# Patient Record
Sex: Female | Born: 1985 | Race: White | Hispanic: No | Marital: Married | Smoking: Former smoker
Health system: Southern US, Community
[De-identification: ages and names within clinical notes are randomized; demographics above are authoritative.]

## PROBLEM LIST (undated history)

## (undated) ENCOUNTER — Inpatient Hospital Stay (HOSPITAL_COMMUNITY): Payer: Self-pay

## (undated) DIAGNOSIS — A6000 Herpesviral infection of urogenital system, unspecified: Secondary | ICD-10-CM

## (undated) DIAGNOSIS — O139 Gestational [pregnancy-induced] hypertension without significant proteinuria, unspecified trimester: Secondary | ICD-10-CM

## (undated) HISTORY — PX: TONSILLECTOMY: SUR1361

## (undated) HISTORY — DX: Gestational (pregnancy-induced) hypertension without significant proteinuria, unspecified trimester: O13.9

---
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
## 2013-02-26 ENCOUNTER — Emergency Department: Payer: Self-pay | Admitting: Emergency Medicine

## 2013-02-26 LAB — DRUG SCREEN, URINE
Benzodiazepine, Ur Scrn: NEGATIVE (ref ?–200)
Cocaine Metabolite,Ur ~~LOC~~: NEGATIVE (ref ?–300)
Methadone, Ur Screen: NEGATIVE (ref ?–300)
Opiate, Ur Screen: NEGATIVE (ref ?–300)
Phencyclidine (PCP) Ur S: NEGATIVE (ref ?–25)
Tricyclic, Ur Screen: NEGATIVE (ref ?–1000)

## 2013-02-26 LAB — CBC
HCT: 40.3 % (ref 35.0–47.0)
MCH: 32.7 pg (ref 26.0–34.0)
MCV: 95 fL (ref 80–100)
Platelet: 179 10*3/uL (ref 150–440)
RBC: 4.25 10*6/uL (ref 3.80–5.20)
RDW: 13 % (ref 11.5–14.5)
WBC: 12.2 10*3/uL — ABNORMAL HIGH (ref 3.6–11.0)

## 2013-02-26 LAB — COMPREHENSIVE METABOLIC PANEL
Albumin: 4.3 g/dL (ref 3.4–5.0)
Alkaline Phosphatase: 75 U/L (ref 50–136)
Bilirubin,Total: 0.7 mg/dL (ref 0.2–1.0)
Chloride: 106 mmol/L (ref 98–107)
Creatinine: 0.83 mg/dL (ref 0.60–1.30)
EGFR (African American): >60
EGFR (Non-African Amer.): >60
Glucose: 107 mg/dL — ABNORMAL HIGH (ref 65–99)
Osmolality: 274 (ref 275–301)
Potassium: 3.8 mmol/L (ref 3.5–5.1)
SGOT(AST): 23 U/L (ref 15–37)
Sodium: 137 mmol/L (ref 136–145)
Total Protein: 8 g/dL (ref 6.4–8.2)

## 2013-02-26 LAB — ETHANOL: Ethanol: <3 mg/dL

## 2013-03-22 ENCOUNTER — Emergency Department (HOSPITAL_COMMUNITY)
Admission: EM | Admit: 2013-03-22 | Discharge: 2013-03-22 | Disposition: A | Discharge status: Home / Self Care | Payer: Medicaid Other | Attending: Emergency Medicine | Admitting: Emergency Medicine

## 2013-03-22 ENCOUNTER — Emergency Department (HOSPITAL_COMMUNITY): Payer: Medicaid Other

## 2013-03-22 ENCOUNTER — Encounter (HOSPITAL_COMMUNITY): Payer: Self-pay | Admitting: *Deleted

## 2013-03-22 DIAGNOSIS — O9989 Other specified diseases and conditions complicating pregnancy, childbirth and the puerperium: Secondary | ICD-10-CM | POA: Insufficient documentation

## 2013-03-22 DIAGNOSIS — O209 Hemorrhage in early pregnancy, unspecified: Secondary | ICD-10-CM | POA: Insufficient documentation

## 2013-03-22 DIAGNOSIS — O99330 Smoking (tobacco) complicating pregnancy, unspecified trimester: Secondary | ICD-10-CM | POA: Insufficient documentation

## 2013-03-22 DIAGNOSIS — R110 Nausea: Secondary | ICD-10-CM | POA: Insufficient documentation

## 2013-03-22 LAB — BASIC METABOLIC PANEL
Calcium: 9.5 mg/dL (ref 8.4–10.5)
Creatinine, Ser: 0.76 mg/dL (ref 0.50–1.10)
GFR calc non Af Amer: >90 mL/min (ref >90)
Glucose, Bld: 94 mg/dL (ref 70–99)
Sodium: 138 mEq/L (ref 135–145)

## 2013-03-22 LAB — CBC WITH DIFFERENTIAL/PLATELET
Basophils Absolute: 0 10*3/uL (ref 0.0–0.1)
Basophils Relative: 0 % (ref 0–1)
Eosinophils Absolute: 0.3 10*3/uL (ref 0.0–0.7)
Eosinophils Relative: 3 % (ref 0–5)
Lymphs Abs: 1.6 10*3/uL (ref 0.7–4.0)
MCH: 32.9 pg (ref 26.0–34.0)
MCV: 94.2 fL (ref 78.0–100.0)
Neutrophils Relative %: 73 % (ref 43–77)
Platelets: 139 10*3/uL — ABNORMAL LOW (ref 150–400)
RBC: 4.34 MIL/uL (ref 3.87–5.11)
RDW: 12.1 % (ref 11.5–15.5)
WBC: 9.2 10*3/uL (ref 4.0–10.5)

## 2013-03-22 LAB — URINALYSIS, ROUTINE W REFLEX MICROSCOPIC
Ketones, ur: NEGATIVE mg/dL
Leukocytes, UA: NEGATIVE
Protein, ur: NEGATIVE mg/dL
Urobilinogen, UA: 0.2 mg/dL (ref 0.0–1.0)

## 2013-03-22 LAB — POCT PREGNANCY, URINE: Preg Test, Ur: POSITIVE — AB

## 2013-03-22 LAB — HCG, QUANTITATIVE, PREGNANCY: hCG, Beta Chain, Quant, S: 52265 m[IU]/mL — ABNORMAL HIGH (ref <5)

## 2013-03-22 LAB — WET PREP, GENITAL
WBC, Wet Prep HPF POC: NONE SEEN
Yeast Wet Prep HPF POC: NONE SEEN

## 2013-03-22 MED ORDER — PROMETHAZINE HCL 25 MG PO TABS: 25.0000 mg | ORAL_TABLET | Freq: Four times a day (QID) | ORAL | Status: DC | PRN

## 2013-03-22 MED ORDER — PRENATAL COMPLETE 14-0.4 MG PO TABS: 1.0000 | ORAL_TABLET | Freq: Every day | ORAL | Status: DC

## 2013-03-22 NOTE — ED Notes (Signed)
Pt reports vaginal spotting and LMP 02/04/13.  Pt reports abdominal cramping all over

## 2013-03-22 NOTE — ED Provider Notes (Signed)
History    CSN: [id number] Arrival date & time 03/22/13  1146  First MD Initiated Contact with Patient 03/22/13 1245     No chief complaint on file.  (Consider location/radiation/quality/duration/timing/severity/associated sxs/prior Treatment) HPI Comments: Patient is a 27 year old G1P0 female who presents with a 1 day history of vaginal spotting. Symptoms started gradually and remained constant since the onset. Patient reports going to the bathroom this morning and noticing some blood on the toilet paper when she wiped. Patient denies abdominal pain but does report associated nausea throughout her pregnancy. Patient estimates she is about 4 weeks. No aggravating/alleviating factors.   History reviewed. No pertinent past medical history. Past Surgical History  Procedure Laterality Date  . Tonsillectomy     No family history on file. History  Substance Use Topics  . Smoking status: Current Every Day Smoker  . Smokeless tobacco: Not on file  . Alcohol Use: No   OB History   Grav Para Term Preterm Abortions TAB SAB Ect Mult Living   1              Review of Systems  Genitourinary: Positive for vaginal bleeding.  All other systems reviewed and are negative.    Allergies  Augmentin  Home Medications   Current Outpatient Rx  Name  Route  Sig  Dispense  Refill  . calcium carbonate (TUMS - DOSED IN MG ELEMENTAL CALCIUM) 500 MG chewable tablet   Oral   Chew 2 tablets by mouth 2 (two) times daily as needed for heartburn.          BP 138/89  Pulse 102  Temp(Src) 98.3 F (36.8 C) (Oral)  Resp 18  SpO2 98%  LMP 02/04/2013 Physical Exam  Nursing note and vitals reviewed. Constitutional: She is oriented to person, place, and time. She appears well-developed and well-nourished. No distress.  HENT:  Head: Normocephalic and atraumatic.  Eyes: Conjunctivae are normal.  Neck: Normal range of motion.  Cardiovascular: Normal rate and regular rhythm.  Exam reveals no gallop  and no friction rub.   No murmur heard. Pulmonary/Chest: Effort normal and breath sounds normal. She has no wheezes. She has no rales. She exhibits no tenderness.  Abdominal: Soft. She exhibits no distension. There is no tenderness. There is no rebound and no guarding.  Genitourinary:  No blood or discharge noted. Cervical os closed. No CMT. No tenderness to palpation on bimanual exam.   Musculoskeletal: Normal range of motion.  Neurological: She is alert and oriented to person, place, and time. Coordination normal.  Speech is goal-oriented. Moves limbs without ataxia.   Skin: Skin is warm and dry.  Psychiatric: She has a normal mood and affect. Her behavior is normal.    ED Course  Procedures (including critical care time) Labs Reviewed  WET PREP, GENITAL - Abnormal; Notable for the following:    Clue Cells Wet Prep HPF POC FEW (*)    All other components within normal limits  URINALYSIS, ROUTINE W REFLEX MICROSCOPIC - Abnormal; Notable for the following:    APPearance CLOUDY (*)    All other components within normal limits  CBC WITH DIFFERENTIAL - Abnormal; Notable for the following:    Platelets 139 (*)    All other components within normal limits  POCT PREGNANCY, URINE - Abnormal; Notable for the following:    Preg Test, Ur POSITIVE (*)    All other components within normal limits  GC/CHLAMYDIA PROBE AMP  BASIC METABOLIC PANEL   US  Ob Comp Less 14 Wks  03/22/2013   *RADIOLOGY REPORT*  Clinical Data: Vaginal bleeding.  OBSTETRIC <14 WK [place] AND TRANSVAGINAL OB US  Technique:  Both transabdominal and transvaginal ultrasound examinations were performed for complete evaluation of the gestation as well as the maternal uterus, adnexal regions, and pelvic cul-de-sac.  Transvaginal technique was performed to assess early pregnancy.  Comparison:  None.  Intrauterine gestational sac:  Single Yolk sac: Present Embryo: Present Cardiac Activity: Present Heart Rate: 113 bpm  CRL: 6.4  mm  6 weeks  3 days  [place] EDC: 11/12/2013  Maternal uterus/adnexae: Normal ovaries.  No free fluid.  IMPRESSION:  1.  Single intrauterine gestation with embryo and normal cardiac activity. 2.  Estimate gestational age by crown-rump length equals 6 weeks 3 days.   Original Report Authenticated By: Genevive Bi, M.D.   US Ob Transvaginal  03/22/2013   *RADIOLOGY REPORT*  Clinical Data: Vaginal bleeding.  OBSTETRIC <14 WK [place] AND TRANSVAGINAL OB US  Technique:  Both transabdominal and transvaginal ultrasound examinations were performed for complete evaluation of the gestation as well as the maternal uterus, adnexal regions, and pelvic cul-de-sac.  Transvaginal technique was performed to assess early pregnancy.  Comparison:  None.  Intrauterine gestational sac:  Single Yolk sac: Present Embryo: Present Cardiac Activity: Present Heart Rate: 113 bpm  CRL: 6.4  mm  6 weeks 3 days  [place] EDC: 11/12/2013  Maternal uterus/adnexae: Normal ovaries.  No free fluid.  IMPRESSION:  1.  Single intrauterine gestation with embryo and normal cardiac activity. 2.  Estimate gestational age by crown-rump length equals 6 weeks 3 days.   Original Report Authenticated By: Genevive Bi, M.D.     1. Vaginal bleeding in pregnancy, first trimester     MDM  1:28 PM Labs unremarkable. Urine pregnancy test positive. Patient will have pelvic exam, US obgyn, and hcg. Vitals stable and patient afebrile.   Patient signed out to [facility] pending OB US.   Emilia Beck, PA-C 03/23/13 1121

## 2013-03-22 NOTE — ED Notes (Signed)
Pt states that she took pregnancy test one week ago.  Pt states that she continues to vomit constantly and it is not getting better

## 2013-03-26 NOTE — ED Provider Notes (Signed)
History/physical exam/procedure(s) were performed by non-physician practitioner and as supervising physician I was immediately available for consultation/collaboration. I have reviewed all notes and am in agreement with care and plan.   Hilario Quarry, MD 03/26/13 1227

## 2013-05-13 LAB — OB RESULTS CONSOLE HEPATITIS B SURFACE ANTIGEN: HEP B S AG: NEGATIVE

## 2013-05-13 LAB — OB RESULTS CONSOLE RUBELLA ANTIBODY, IGM: RUBELLA: IMMUNE

## 2013-05-13 LAB — OB RESULTS CONSOLE ABO/RH: RH Type: POSITIVE

## 2013-05-13 LAB — OB RESULTS CONSOLE RPR: RPR: NONREACTIVE

## 2013-05-13 LAB — OB RESULTS CONSOLE ANTIBODY SCREEN: Antibody Screen: NEGATIVE

## 2013-05-13 LAB — OB RESULTS CONSOLE HIV ANTIBODY (ROUTINE TESTING): HIV: NONREACTIVE

## 2013-09-26 LAB — OB RESULTS CONSOLE GC/CHLAMYDIA
Chlamydia: NEGATIVE
Gonorrhea: NEGATIVE

## 2013-09-29 NOTE — L&D Delivery Note (Signed)
Delivery Note At 11:03 PM a viable female, "Shannon Jarvis", was delivered via Vaginal, Spontaneous Delivery (Presentation:ROA ;  ).  APGAR: , ; weight .   Placenta status: Intact, Spontaneous.  Cord:  with the following complications: None.  Cord pH: NA  Anesthesia:  Epidural Episiotomy: None Lacerations: 1st degree vaginal laceration Suture Repair: 2.0 vicryl rapide Est. Blood Loss (mL): 200 cc  Mom to postpartum.  Baby to Couplet care / Skin to Skin. Family plans outpatient circumcision. Will start magnesium sulfate for 24 hour pp. Repeat PIH labs in am.  Nigel BridgemanLATHAM, Hanne Kegg 11/02/2013, 11:47 PM

## 2013-10-19 LAB — OB RESULTS CONSOLE GBS: GBS: POSITIVE

## 2013-10-31 ENCOUNTER — Encounter (HOSPITAL_COMMUNITY): Payer: Self-pay | Admitting: *Deleted

## 2013-10-31 ENCOUNTER — Inpatient Hospital Stay (HOSPITAL_COMMUNITY)
Admission: AD | Admit: 2013-10-31 | Discharge: 2013-10-31 | Disposition: A | Discharge status: Home / Self Care | Payer: Medicaid Other | Source: Ambulatory Visit | Attending: Obstetrics and Gynecology | Admitting: Obstetrics and Gynecology

## 2013-10-31 DIAGNOSIS — R109 Unspecified abdominal pain: Secondary | ICD-10-CM | POA: Insufficient documentation

## 2013-10-31 DIAGNOSIS — IMO0002 Reserved for concepts with insufficient information to code with codable children: Secondary | ICD-10-CM | POA: Insufficient documentation

## 2013-10-31 HISTORY — DX: Herpesviral infection of urogenital system, unspecified: A60.00

## 2013-10-31 LAB — CBC
HCT: 35.1 % — ABNORMAL LOW (ref 36.0–46.0)
HEMOGLOBIN: 11.9 g/dL — AB (ref 12.0–15.0)
MCH: 32.8 pg (ref 26.0–34.0)
MCHC: 33.9 g/dL (ref 30.0–36.0)
MCV: 96.7 fL (ref 78.0–100.0)
Platelets: 110 10*3/uL — ABNORMAL LOW (ref 150–400)
RBC: 3.63 MIL/uL — AB (ref 3.87–5.11)
RDW: 13.2 % (ref 11.5–15.5)
WBC: 11.4 10*3/uL — ABNORMAL HIGH (ref 4.0–10.5)

## 2013-10-31 LAB — URINALYSIS, ROUTINE W REFLEX MICROSCOPIC
BILIRUBIN URINE: NEGATIVE
Glucose, UA: NEGATIVE mg/dL
HGB URINE DIPSTICK: NEGATIVE
Ketones, ur: NEGATIVE mg/dL
Leukocytes, UA: NEGATIVE
Nitrite: NEGATIVE
PROTEIN: 30 mg/dL — AB
Specific Gravity, Urine: 1.02 (ref 1.005–1.030)
UROBILINOGEN UA: 0.2 mg/dL (ref 0.0–1.0)
pH: 8 (ref 5.0–8.0)

## 2013-10-31 LAB — COMPREHENSIVE METABOLIC PANEL
ALT: 8 U/L (ref 0–35)
AST: 15 U/L (ref 0–37)
Albumin: 2.2 g/dL — ABNORMAL LOW (ref 3.5–5.2)
Alkaline Phosphatase: 144 U/L — ABNORMAL HIGH (ref 39–117)
BUN: 11 mg/dL (ref 6–23)
CALCIUM: 9.1 mg/dL (ref 8.4–10.5)
CHLORIDE: 101 meq/L (ref 96–112)
CO2: 23 meq/L (ref 19–32)
Creatinine, Ser: 0.66 mg/dL (ref 0.50–1.10)
GLUCOSE: 91 mg/dL (ref 70–99)
Potassium: 4.5 mEq/L (ref 3.7–5.3)
SODIUM: 136 meq/L — AB (ref 137–147)
Total Protein: 6.1 g/dL (ref 6.0–8.3)

## 2013-10-31 LAB — URINE MICROSCOPIC-ADD ON

## 2013-10-31 LAB — PROTEIN / CREATININE RATIO, URINE
Creatinine, Urine: 181.25 mg/dL
Protein Creatinine Ratio: 0.3 — ABNORMAL HIGH (ref 0.00–0.15)
TOTAL PROTEIN, URINE: 54.7 mg/dL

## 2013-10-31 NOTE — Discharge Instructions (Signed)
Preeclampsia and Eclampsia °Preeclampsia is a condition of high blood pressure during pregnancy. It can happen at 20 weeks or later in pregnancy. If high blood pressure occurs in the second half of pregnancy with no other symptoms, it is called gestational hypertension and goes away after the baby is born. If any of the symptoms listed below develop with gestational hypertension, it is then called preeclampsia. Eclampsia (convulsions) may follow preeclampsia. This is one of the reasons for regular prenatal checkups. Early diagnosis and treatment are very important to prevent eclampsia. °CAUSES  °There is no known cause of preeclampsia/eclampsia in pregnancy. There are several known conditions that may put the pregnant woman at risk, such as: °· The first pregnancy. °· Having preeclampsia in a past pregnancy. °· Having lasting (chronic) high blood pressure. °· Having multiples (twins, triplets). °· Being age 35 or older. °· African American ethnic background. °· Having kidney disease or diabetes. °· Medical conditions such as lupus or blood diseases. °· Being overweight (obese). °SYMPTOMS  °· High blood pressure. °· Headaches. °· Sudden weight gain. °· Swelling of hands, face, legs, and feet. °· Protein in the urine. °· Feeling sick to your stomach (nauseous) and throwing up (vomiting). °· Vision problems (blurred or double vision). °· Numbness in the face, arms, legs, and feet. °· Dizziness. °· Slurred speech. °· Preeclampsia can cause growth retardation in the fetus. °· Separation (abruption) of the placenta. °· Not enough fluid in the amniotic sac (oligohydramnios). °· Sensitivity to bright lights. °· Belly (abdominal) pain. °DIAGNOSIS  °If protein is found in the urine in the second half of pregnancy, this is considered preeclampsia. Other symptoms mentioned above may also be present. °TREATMENT  °It is necessary to treat this. °· Your caregiver may prescribe bed rest early in this condition. Plenty of rest and  salt restriction may be all that is needed. °· Medicines may be necessary to lower blood pressure if the condition does not respond to more conservative measures. °· In more severe cases, hospitalization may be needed: °· For treatment of blood pressure. °· To control fluid retention. °· To monitor the baby to see if the condition is causing harm to the baby. °· Hospitalization is the best way to treat the first sign of preeclampsia. This is so the mother and baby can be watched closely and blood tests can be done effectively and correctly. °· If the condition becomes severe, it may be necessary to induce labor or to remove the infant by surgical means (cesarean section). The best cure for preeclampsia/eclampsia is to deliver the baby. °Preeclampsia and eclampsia involve risks to mother and infant. Your caregiver will discuss these risks with you. Together, you can work out the best possible approach to your problems. Make sure you keep your prenatal visits as scheduled. Not keeping appointments could result in a chronic or permanent injury, pain, disability to you, and death or injury to you or your unborn baby. If there is any problem keeping the appointment, you must call to reschedule. °HOME CARE INSTRUCTIONS  °· Keep your prenatal appointments and tests as scheduled. °· Tell your caregiver if you have any of the above risk factors. °· Get plenty of rest and sleep. °· Eat a balanced diet that is low in salt, and do not add salt to your food. °· Avoid stressful situations. °· Only take over-the-counter and prescriptions medicines for pain, discomfort, or fever as directed by your caregiver. °SEEK IMMEDIATE MEDICAL CARE IF:  °· You develop severe swelling   anywhere in the body. This usually occurs in the legs. °· You gain 05 lb/2.3 kg or more in a week. °· You develop a severe headache, dizziness, problems with your vision, or confusion. °· You have abdominal pain, nausea, or vomiting. °· You have a seizure. °· You  have trouble moving any part of your body, or you develop numbness or problems speaking. °· You have bruising or abnormal bleeding from anywhere in the body. °· You develop a stiff neck. °· You pass out. °MAKE SURE YOU:  °· Understand these instructions. °· Will watch your condition. °· Will get help right away if you are not doing well or get worse. °Document Released: 09/12/2000 Document Revised: 12/08/2011 Document Reviewed: 04/28/2008 °ExitCare® Patient Information ©2014 [employer]. ° °Fetal Movement Counts °Patient Name: __________________________________________________ Patient Due Date: ____________________ °Performing a fetal movement count is highly recommended in high-risk pregnancies, but it is good for every pregnant woman to do. Your caregiver may ask you to start counting fetal movements at 28 weeks of the pregnancy. Fetal movements often increase: °· After eating a full meal. °· After physical activity. °· After eating or drinking something sweet or cold. °· At rest. °Pay attention to when you feel the baby is most active. This will help you notice a pattern of your baby's sleep and wake cycles and what factors contribute to an increase in fetal movement. It is important to perform a fetal movement count at the same time each day when your baby is normally most active.  °HOW TO COUNT FETAL MOVEMENTS °1. Find a quiet and comfortable area to sit or lie down on your left side. Lying on your left side provides the best blood and oxygen circulation to your baby. °2. Write down the day and time on a sheet of paper or in a journal. °3. Start counting kicks, flutters, swishes, rolls, or jabs in a 2 hour period. You should feel at least 10 movements within 2 hours. °4. If you do not feel 10 movements in 2 hours, wait 2 3 hours and count again. Look for a change in the pattern or not enough counts in 2 hours. °SEEK MEDICAL CARE IF: °· You feel less than 10 counts in 2 hours, tried twice. °· There is no  movement in over an hour. °· The pattern is changing or taking longer each day to reach 10 counts in 2 hours. °· You feel the baby is not moving as he or she usually does. °Date: ____________ Movements: ____________ Start time: ____________ Finish time: ____________  °Date: ____________ Movements: ____________ Start time: ____________ Finish time: ____________ °Date: ____________ Movements: ____________ Start time: ____________ Finish time: ____________ °Date: ____________ Movements: ____________ Start time: ____________ Finish time: ____________ °Date: ____________ Movements: ____________ Start time: ____________ Finish time: ____________ °Date: ____________ Movements: ____________ Start time: ____________ Finish time: ____________ °Date: ____________ Movements: ____________ Start time: ____________ Finish time: ____________ °Date: ____________ Movements: ____________ Start time: ____________ Finish time: ____________  °Date: ____________ Movements: ____________ Start time: ____________ Finish time: ____________ °Date: ____________ Movements: ____________ Start time: ____________ Finish time: ____________ °Date: ____________ Movements: ____________ Start time: ____________ Finish time: ____________ °Date: ____________ Movements: ____________ Start time: ____________ Finish time: ____________ °Date: ____________ Movements: ____________ Start time: ____________ Finish time: ____________ °Date: ____________ Movements: ____________ Start time: ____________ Finish time: ____________ °Date: ____________ Movements: ____________ Start time: ____________ Finish time: ____________  °Date: ____________ Movements: ____________ Start time: ____________ Finish time: ____________ °Date: ____________ Movements: ____________ Start time: ____________ Finish time: ____________ °  ____________ Movements: ____________ Start time: ____________ Doreatha MartinFinish time: ____________ Date: ____________ Movements: ____________ Start time:  ____________ Doreatha MartinFinish time: ____________ Date: ____________ Movements: ____________ Start time: ____________ Doreatha MartinFinish time: ____________ Date: ____________ Movements: ____________ Start time: ____________ Doreatha MartinFinish time: ____________ Date: ____________ Movements: ____________ Start time: ____________ Doreatha MartinFinish time: ____________  Date: ____________ Movements: ____________ Start time: ____________ Doreatha MartinFinish time: ____________ Date: ____________ Movements: ____________ Start time: ____________ Doreatha MartinFinish time: ____________ Date: ____________ Movements: ____________ Start time: ____________ Doreatha MartinFinish time: ____________ Date: ____________ Movements: ____________ Start time: ____________ Doreatha MartinFinish time: ____________ Date: ____________ Movements: ____________ Start time: ____________ Doreatha MartinFinish time: ____________ Date: ____________ Movements: ____________ Start time: ____________ Doreatha MartinFinish time: ____________ Date: ____________ Movements: ____________ Start time: ____________ Doreatha MartinFinish time: ____________  Date: ____________ Movements: ____________ Start time: ____________ Doreatha MartinFinish time: ____________ Date: ____________ Movements: ____________ Start time: ____________ Doreatha MartinFinish time: ____________ Date: ____________ Movements: ____________ Start time: ____________ Doreatha MartinFinish time: ____________ Date: ____________ Movements: ____________ Start time: ____________ Doreatha MartinFinish time: ____________ Date: ____________ Movements: ____________ Start time: ____________ Doreatha MartinFinish time: ____________ Date: ____________ Movements: ____________ Start time: ____________ Doreatha MartinFinish time: ____________ Date: ____________ Movements: ____________ Start time: ____________ Doreatha MartinFinish time: ____________  Date: ____________ Movements: ____________ Start time: ____________ Doreatha MartinFinish time: ____________ Date: ____________ Movements: ____________ Start time: ____________ Doreatha MartinFinish time: ____________ Date: ____________ Movements: ____________ Start time: ____________ Doreatha MartinFinish time: ____________ Date:  ____________ Movements: ____________ Start time: ____________ Doreatha MartinFinish time: ____________ Date: ____________ Movements: ____________ Start time: ____________ Doreatha MartinFinish time: ____________ Date: ____________ Movements: ____________ Start time: ____________ Doreatha MartinFinish time: ____________ Date: ____________ Movements: ____________ Start time: ____________ Doreatha MartinFinish time: ____________  Date: ____________ Movements: ____________ Start time: ____________ Doreatha MartinFinish time: ____________ Date: ____________ Movements: ____________ Start time: ____________ Doreatha MartinFinish time: ____________ Date: ____________ Movements: ____________ Start time: ____________ Doreatha MartinFinish time: ____________ Date: ____________ Movements: ____________ Start time: ____________ Doreatha MartinFinish time: ____________ Date: ____________ Movements: ____________ Start time: ____________ Doreatha MartinFinish time: ____________ Date: ____________ Movements: ____________ Start time: ____________ Doreatha MartinFinish time: ____________ Date: ____________ Movements: ____________ Start time: ____________ Doreatha MartinFinish time: ____________  Date: ____________ Movements: ____________ Start time: ____________ Doreatha MartinFinish time: ____________ Date: ____________ Movements: ____________ Start time: ____________ Doreatha MartinFinish time: ____________ Date: ____________ Movements: ____________ Start time: ____________ Doreatha MartinFinish time: ____________ Date: ____________ Movements: ____________ Start time: ____________ Doreatha MartinFinish time: ____________ Date: ____________ Movements: ____________ Start time: ____________ Doreatha MartinFinish time: ____________ Date: ____________ Movements: ____________ Start time: ____________ Doreatha MartinFinish time: ____________ Document Released: 10/15/2006 Document Revised: 09/01/2012 Document Reviewed: 07/12/2012 Exit[employer] Patient Information 2014 [employer].

## 2013-10-31 NOTE — MAU Note (Signed)
Visual changes when changing positions quickly, noticed increased edema over the last 2-3 weeks, has gained approx 4-5 lbs since last week.  Checked BP @ [facility], was 157/96, then 124/86.  Pt denies bleeding or LOF.  Lower abd pain worse in the last 1-2 weeks.

## 2013-11-01 ENCOUNTER — Encounter (HOSPITAL_COMMUNITY): Payer: Self-pay | Admitting: *Deleted

## 2013-11-01 ENCOUNTER — Inpatient Hospital Stay (HOSPITAL_COMMUNITY)
Admission: AD | Admit: 2013-11-01 | Discharge: 2013-11-04 | DRG: 774 | Disposition: A | Discharge status: Home / Self Care | Payer: Medicaid Other | Source: Ambulatory Visit | Attending: Obstetrics and Gynecology | Admitting: Obstetrics and Gynecology

## 2013-11-01 DIAGNOSIS — B009 Herpesviral infection, unspecified: Secondary | ICD-10-CM | POA: Diagnosis not present

## 2013-11-01 DIAGNOSIS — O9903 Anemia complicating the puerperium: Secondary | ICD-10-CM | POA: Diagnosis not present

## 2013-11-01 DIAGNOSIS — B951 Streptococcus, group B, as the cause of diseases classified elsewhere: Secondary | ICD-10-CM | POA: Diagnosis present

## 2013-11-01 DIAGNOSIS — O99214 Obesity complicating childbirth: Secondary | ICD-10-CM

## 2013-11-01 DIAGNOSIS — O09299 Supervision of pregnancy with other poor reproductive or obstetric history, unspecified trimester: Secondary | ICD-10-CM | POA: Diagnosis present

## 2013-11-01 DIAGNOSIS — O9989 Other specified diseases and conditions complicating pregnancy, childbirth and the puerperium: Secondary | ICD-10-CM

## 2013-11-01 DIAGNOSIS — O98519 Other viral diseases complicating pregnancy, unspecified trimester: Secondary | ICD-10-CM | POA: Diagnosis present

## 2013-11-01 DIAGNOSIS — K219 Gastro-esophageal reflux disease without esophagitis: Secondary | ICD-10-CM | POA: Diagnosis present

## 2013-11-01 DIAGNOSIS — N907 Vulvar cyst: Secondary | ICD-10-CM | POA: Diagnosis present

## 2013-11-01 DIAGNOSIS — IMO0002 Reserved for concepts with insufficient information to code with codable children: Principal | ICD-10-CM | POA: Diagnosis present

## 2013-11-01 DIAGNOSIS — D696 Thrombocytopenia, unspecified: Secondary | ICD-10-CM | POA: Diagnosis present

## 2013-11-01 DIAGNOSIS — Z22330 Carrier of Group B streptococcus: Secondary | ICD-10-CM

## 2013-11-01 DIAGNOSIS — E669 Obesity, unspecified: Secondary | ICD-10-CM | POA: Diagnosis present

## 2013-11-01 DIAGNOSIS — D649 Anemia, unspecified: Secondary | ICD-10-CM | POA: Diagnosis not present

## 2013-11-01 DIAGNOSIS — O9912 Other diseases of the blood and blood-forming organs and certain disorders involving the immune mechanism complicating childbirth: Secondary | ICD-10-CM

## 2013-11-01 DIAGNOSIS — F17200 Nicotine dependence, unspecified, uncomplicated: Secondary | ICD-10-CM | POA: Diagnosis present

## 2013-11-01 DIAGNOSIS — D689 Coagulation defect, unspecified: Secondary | ICD-10-CM | POA: Diagnosis present

## 2013-11-01 DIAGNOSIS — O99334 Smoking (tobacco) complicating childbirth: Secondary | ICD-10-CM | POA: Diagnosis present

## 2013-11-01 DIAGNOSIS — A6000 Herpesviral infection of urogenital system, unspecified: Secondary | ICD-10-CM | POA: Diagnosis present

## 2013-11-01 DIAGNOSIS — O99892 Other specified diseases and conditions complicating childbirth: Secondary | ICD-10-CM | POA: Diagnosis present

## 2013-11-01 LAB — URINE CULTURE
CULTURE: NO GROWTH
Colony Count: NO GROWTH

## 2013-11-01 LAB — PROTEIN / CREATININE RATIO, URINE
CREATININE, URINE: 186.19 mg/dL
PROTEIN CREATININE RATIO: 0.31 — AB (ref 0.00–0.15)
TOTAL PROTEIN, URINE: 56.8 mg/dL

## 2013-11-01 LAB — COMPREHENSIVE METABOLIC PANEL
ALT: 10 U/L (ref 0–35)
AST: 19 U/L (ref 0–37)
Albumin: 2.3 g/dL — ABNORMAL LOW (ref 3.5–5.2)
Alkaline Phosphatase: 161 U/L — ABNORMAL HIGH (ref 39–117)
BUN: 12 mg/dL (ref 6–23)
CALCIUM: 9.6 mg/dL (ref 8.4–10.5)
CO2: 24 meq/L (ref 19–32)
CREATININE: 0.63 mg/dL (ref 0.50–1.10)
Chloride: 101 mEq/L (ref 96–112)
GFR calc non Af Amer: >90 mL/min (ref >90)
GLUCOSE: 76 mg/dL (ref 70–99)
Potassium: 4.7 mEq/L (ref 3.7–5.3)
Sodium: 137 mEq/L (ref 137–147)
TOTAL PROTEIN: 6 g/dL (ref 6.0–8.3)
Total Bilirubin: <0.2 mg/dL — ABNORMAL LOW (ref 0.3–1.2)

## 2013-11-01 LAB — CBC
HCT: 37.1 % (ref 36.0–46.0)
Hemoglobin: 12.9 g/dL (ref 12.0–15.0)
MCH: 33.5 pg (ref 26.0–34.0)
MCHC: 34.8 g/dL (ref 30.0–36.0)
MCV: 96.4 fL (ref 78.0–100.0)
PLATELETS: 112 10*3/uL — AB (ref 150–400)
RBC: 3.85 MIL/uL — AB (ref 3.87–5.11)
RDW: 13.2 % (ref 11.5–15.5)
WBC: 11.4 10*3/uL — ABNORMAL HIGH (ref 4.0–10.5)

## 2013-11-01 LAB — URIC ACID: Uric Acid, Serum: 6 mg/dL (ref 2.4–7.0)

## 2013-11-01 LAB — LACTATE DEHYDROGENASE: LDH: 270 U/L — ABNORMAL HIGH (ref 94–250)

## 2013-11-01 MED ORDER — PENICILLIN G POTASSIUM 5000000 UNITS IJ SOLR: 2.5000 10*6.[IU] | INTRAMUSCULAR | Status: DC

## 2013-11-01 MED ORDER — CITRIC ACID-SODIUM CITRATE 334-500 MG/5ML PO SOLN
30.0000 mL | ORAL | Status: DC | PRN
  Administered 2013-11-02: 30 mL via ORAL
  Filled 2013-11-01: qty 15

## 2013-11-01 MED ORDER — PENICILLIN G POTASSIUM 5000000 UNITS IJ SOLR: 5.0000 10*6.[IU] | Freq: Once | INTRAVENOUS | Status: DC

## 2013-11-01 MED ORDER — LACTATED RINGERS IV SOLN
INTRAVENOUS | Status: DC
  Administered 2013-11-01 – 2013-11-02 (×4): via INTRAVENOUS

## 2013-11-01 MED ORDER — FAMOTIDINE 20 MG PO TABS
20.0000 mg | ORAL_TABLET | Freq: Once | ORAL | Status: AC
  Administered 2013-11-01: 20 mg via ORAL
  Filled 2013-11-01: qty 1

## 2013-11-01 MED ORDER — ONDANSETRON HCL 4 MG/2ML IJ SOLN: 4.0000 mg | Freq: Four times a day (QID) | INTRAMUSCULAR | Status: DC | PRN

## 2013-11-01 MED ORDER — MAGNESIUM SULFATE 40 G IN LACTATED RINGERS - SIMPLE
2.0000 g/h | INTRAVENOUS | Status: DC
  Filled 2013-11-01: qty 500

## 2013-11-01 MED ORDER — LIDOCAINE HCL (PF) 1 % IJ SOLN
30.0000 mL | INTRAMUSCULAR | Status: DC | PRN
  Filled 2013-11-01 (×2): qty 30

## 2013-11-01 MED ORDER — OXYTOCIN BOLUS FROM INFUSION
500.0000 mL | INTRAVENOUS | Status: DC
  Administered 2013-11-02: 500 mL via INTRAVENOUS

## 2013-11-01 MED ORDER — LACTATED RINGERS IV SOLN: 500.0000 mL | INTRAVENOUS | Status: DC | PRN

## 2013-11-01 MED ORDER — ACETAMINOPHEN 325 MG PO TABS: 650.0000 mg | ORAL_TABLET | ORAL | Status: DC | PRN

## 2013-11-01 MED ORDER — BUTORPHANOL TARTRATE 1 MG/ML IJ SOLN: 1.0000 mg | INTRAMUSCULAR | Status: DC | PRN

## 2013-11-01 MED ORDER — MISOPROSTOL 25 MCG QUARTER TABLET
25.0000 ug | ORAL_TABLET | ORAL | Status: DC | PRN
  Administered 2013-11-02 (×2): 25 ug via VAGINAL
  Filled 2013-11-01: qty 1
  Filled 2013-11-01 (×2): qty 0.25

## 2013-11-01 MED ORDER — FLEET ENEMA 7-19 GM/118ML RE ENEM: 1.0000 | ENEMA | RECTAL | Status: DC | PRN

## 2013-11-01 MED ORDER — OXYTOCIN 40 UNITS IN LACTATED RINGERS INFUSION - SIMPLE MED: 62.5000 mL/h | INTRAVENOUS | Status: DC

## 2013-11-01 MED ORDER — OXYCODONE-ACETAMINOPHEN 5-325 MG PO TABS
1.0000 | ORAL_TABLET | ORAL | Status: DC | PRN
Start: 2013-11-01 — End: 2013-11-03

## 2013-11-01 MED ORDER — TERBUTALINE SULFATE 1 MG/ML IJ SOLN: 0.2500 mg | Freq: Once | INTRAMUSCULAR | Status: AC | PRN

## 2013-11-01 MED ORDER — MAGNESIUM SULFATE BOLUS VIA INFUSION
4.0000 g | Freq: Once | INTRAVENOUS | Status: AC
  Administered 2013-11-02: 4 g via INTRAVENOUS
  Filled 2013-11-01: qty 500

## 2013-11-01 MED ORDER — IBUPROFEN 600 MG PO TABS
600.0000 mg | ORAL_TABLET | Freq: Four times a day (QID) | ORAL | Status: DC | PRN
  Administered 2013-11-02: 600 mg via ORAL
  Filled 2013-11-01: qty 1

## 2013-11-01 MED ORDER — OXYTOCIN 40 UNITS IN LACTATED RINGERS INFUSION - SIMPLE MED: 1.0000 m[IU]/min | INTRAVENOUS | Status: DC

## 2013-11-01 NOTE — H&P (Signed)
Shannon Jarvis is a 28 y.o. female, G1P0 at 5238 3/7 weeks, presenting for induction due to mild pre-eclampsia.  Seen yesterday in MAU for elevated BP, increased edema.  PCR was 0.30, cervix 1 cm.  Instructed to f/u at office today.  Still reporting persistent HA, some visual floaters, no epigastric pain.  Evaluated in MAU, with PCR 0.31, and BP 122-137/83-93.  Platelets 112 today (110 yesterday). Admitted now for induction due to mild pre-eclampsia.  Patient Active Problem List   Diagnosis Date Noted  . 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 Mild or unspecified pre-eclampsia 11/01/2013  . Positive GBS test 11/01/2013    History of present pregnancy: Patient entered care at 17 3/7 weeks. EDC of  was established by LMP and in agreement with US at 20 3/7 weeks. Anatomy scan:  20 3/7 weeks with normal findings and an posterior placenta.   Additional US evaluations:  None   Significant prenatal events:  Blurry vision at 24 weeks, normotensive.  Flu vaccine and TDaP declined.  Seen at MAU at 33 weeks due to pain/pressure.  HSV outbreak on 1/13, despite being on Valtrex--not an initial outbreak.   Seen in MAU 10/31/13, for Shannon Jarvis evaluation.  Elevated PCR (0.31).  1-2+ edema noted last several weeks, but normotensive in office. Last evaluation:  11/01/13, reactive NST done for decreased FM.  Sent to MAU for further assessment of persistent HA and floaters.  OB History   Grav Para Term Preterm Abortions TAB SAB Ect Mult Living   1              Past Medical History  Diagnosis Date  . Genital herpes    Past Surgical History  Procedure Laterality Date  . Tonsillectomy     Family History: family history includes Lupus in her maternal aunt. PGF heart dx;  Mother varicose veins.  Social History:  reports that she has been smoking Cigarettes.  She has been smoking about 0.25 packs per day. She does not have any smokeless tobacco history on file. She reports that she does not drink  alcohol or use illicit drugs.  [name] is involved and supportive.  Patient has 2 years of college, is single, and is a homemaker.   Prenatal Transfer Tool  Maternal Diabetes: No Genetic Screening: Normal Quad screen Maternal Ultrasounds/Referrals: Normal Fetal Ultrasounds or other Referrals:  None Maternal Substance Abuse:  Yes:  Type: Smoker Significant Maternal Medications:  None Significant Maternal Lab Results: Lab values include: Group B Strep positive    ROS:  Mild HA, occasional contractions, +FM.  Allergies  Allergen Reactions  . Augmentin [Amoxicillin-Pot Clavulanate] Hives and Other (See Comments)    Joint swelling  Can take Amoxicillin   Dilation: 1 Effacement (%): 60 Station: -1 Exam by:: Shannon Jarvis CNM Blood pressure 135/85, pulse 94, temperature 98.6 F (37 C), temperature source Oral, resp. rate 18, height 5\' 3"  (1.6 m), weight 216 lb (97.977 kg), last menstrual period 02/04/2013.  Filed Vitals:   11/01/13 2101 11/01/13 2131 11/01/13 2214 11/02/13 0000  BP: 136/83 129/84 137/89 135/85  Pulse: 91 85 92 94  Temp:   98.6 F (37 C) 98.9 F (37.2 C)  TempSrc:   Oral Oral  Resp:   18 18  Height:      Weight:         Chest clear Heart RRR without murmur Abd gravid, NT, FH 38 cm Pelvic: See above Ext: DTR 1+, no clonus, 1+ edema  FHR:  Category 1 UCs: Occasional, mild  Prenatal labs: ABO, Rh: O/Positive/-- (08/15 0000) Antibody: Negative (08/15 0000) Rubella:   Immune RPR: Nonreactive (08/15 0000)  HBsAg: Negative (08/15 0000)  HIV: Non-reactive (08/15 0000)  GBS: Positive (01/21 0000) Sickle cell/Hgb electrophoresis:  NA Pap:  05/2013 WNL GC:  Negative 04/2013 and 08/2013 Chlamydia:  Negative 04/2013 and 08/2013 Genetic screenings:  Normal Quad screen Glucola:  WNL Other:  Hgb 12.5 at [facility], 12.2 at 08/16/13, 13 10/19/13. Platelets 146 at [facility], 149 08/16/13, 129 10/19/13. Toxo titers negative       Assessment/Plan: IUP at 38 4/7 weeks Mild  pre-eclampsia GBS positive  Plan: Admitted to [facility] per consult with Shannon Jarvis induction. Routine [facility] orders Plan Cytotech 25 mcg q 4 hours prn, then pitocin. Plan PCN G per standard dosing (can take Ampicillin) Plan magnesium sulfate with onset of active labor. Repeat PIH labs in am. Patient currently waiting for bed in L&D--will defer exam until transferred to [facility]. Reviewed R&B of induction, including prolonged labor, need for serial induction, need for C/S.  Patient and husband seem to understand these risks and wish to proceed.  Shannon Jarvis, Shannon Jarvis, [place] 11/01/13, 7:32p

## 2013-11-01 NOTE — MAU Note (Signed)
Patient states was sent from the office for evaluation. States she was seen in MAU last night. States she had blood and protein in her urine in the office. States she has vomited every morning for the past 4 days. Has been having headaches.

## 2013-11-01 NOTE — MAU Note (Signed)
C/o  N&V once a day for past 4 days; c/o headache that started this AM and took Tylenol 325 mg this AM; headache is better now and pt rates pain @ 3 now but earlier the pain was 9;

## 2013-11-02 ENCOUNTER — Encounter (HOSPITAL_COMMUNITY): Payer: Medicaid Other | Admitting: Anesthesiology

## 2013-11-02 ENCOUNTER — Inpatient Hospital Stay (HOSPITAL_COMMUNITY): Payer: Medicaid Other | Admitting: Anesthesiology

## 2013-11-02 DIAGNOSIS — B009 Herpesviral infection, unspecified: Secondary | ICD-10-CM | POA: Diagnosis not present

## 2013-11-02 DIAGNOSIS — N907 Vulvar cyst: Secondary | ICD-10-CM | POA: Diagnosis present

## 2013-11-02 DIAGNOSIS — D696 Thrombocytopenia, unspecified: Secondary | ICD-10-CM | POA: Diagnosis present

## 2013-11-02 DIAGNOSIS — F17200 Nicotine dependence, unspecified, uncomplicated: Secondary | ICD-10-CM | POA: Diagnosis present

## 2013-11-02 LAB — CBC
HCT: 34.2 % — ABNORMAL LOW (ref 36.0–46.0)
HCT: 36.1 % (ref 36.0–46.0)
Hemoglobin: 11.8 g/dL — ABNORMAL LOW (ref 12.0–15.0)
Hemoglobin: 12.1 g/dL (ref 12.0–15.0)
MCH: 33.3 pg (ref 26.0–34.0)
MCH: 32.3 pg (ref 26.0–34.0)
MCHC: 34.5 g/dL (ref 30.0–36.0)
MCHC: 33.5 g/dL (ref 30.0–36.0)
MCV: 96.6 fL (ref 78.0–100.0)
MCV: 96.3 fL (ref 78.0–100.0)
PLATELETS: 96 10*3/uL — AB (ref 150–400)
Platelets: 97 10*3/uL — ABNORMAL LOW (ref 150–400)
RBC: 3.54 MIL/uL — AB (ref 3.87–5.11)
RBC: 3.75 MIL/uL — ABNORMAL LOW (ref 3.87–5.11)
RDW: 13.3 % (ref 11.5–15.5)
RDW: 13.2 % (ref 11.5–15.5)
WBC: 11.6 10*3/uL — ABNORMAL HIGH (ref 4.0–10.5)
WBC: 11.5 10*3/uL — AB (ref 4.0–10.5)

## 2013-11-02 LAB — COMPREHENSIVE METABOLIC PANEL
ALT: 8 U/L (ref 0–35)
AST: 14 U/L (ref 0–37)
Albumin: 2 g/dL — ABNORMAL LOW (ref 3.5–5.2)
Alkaline Phosphatase: 141 U/L — ABNORMAL HIGH (ref 39–117)
BUN: 11 mg/dL (ref 6–23)
CALCIUM: 8.6 mg/dL (ref 8.4–10.5)
CO2: 21 mEq/L (ref 19–32)
CREATININE: 0.66 mg/dL (ref 0.50–1.10)
Chloride: 103 mEq/L (ref 96–112)
GFR calc non Af Amer: >90 mL/min (ref >90)
Glucose, Bld: 93 mg/dL (ref 70–99)
Potassium: 4.4 mEq/L (ref 3.7–5.3)
Sodium: 137 mEq/L (ref 137–147)
Total Bilirubin: <0.2 mg/dL — ABNORMAL LOW (ref 0.3–1.2)
Total Protein: 5.2 g/dL — ABNORMAL LOW (ref 6.0–8.3)

## 2013-11-02 LAB — TYPE AND SCREEN
ABO/RH(D): O POS
ANTIBODY SCREEN: NEGATIVE

## 2013-11-02 LAB — LACTATE DEHYDROGENASE: LDH: 185 U/L (ref 94–250)

## 2013-11-02 LAB — RPR: RPR Ser Ql: NONREACTIVE

## 2013-11-02 LAB — URIC ACID: URIC ACID, SERUM: 5.9 mg/dL (ref 2.4–7.0)

## 2013-11-02 LAB — ABO/RH: ABO/RH(D): O POS

## 2013-11-02 MED ORDER — DEXTROSE 5 % IV SOLN
2.5000 10*6.[IU] | INTRAVENOUS | Status: DC
  Filled 2013-11-02 (×2): qty 2.5

## 2013-11-02 MED ORDER — EPHEDRINE 5 MG/ML INJ
10.0000 mg | INTRAVENOUS | Status: DC | PRN
  Filled 2013-11-02: qty 2

## 2013-11-02 MED ORDER — FENTANYL 2.5 MCG/ML BUPIVACAINE 1/10 % EPIDURAL INFUSION (WH - ANES)
14.0000 mL/h | INTRAMUSCULAR | Status: DC | PRN
  Filled 2013-11-02: qty 125

## 2013-11-02 MED ORDER — ZOLPIDEM TARTRATE 5 MG PO TABS
5.0000 mg | ORAL_TABLET | Freq: Every evening | ORAL | Status: DC | PRN
  Administered 2013-11-02: 5 mg via ORAL
  Filled 2013-11-02: qty 1

## 2013-11-02 MED ORDER — PENICILLIN G POTASSIUM 5000000 UNITS IJ SOLR
5.0000 10*6.[IU] | Freq: Once | INTRAVENOUS | Status: AC
  Administered 2013-11-02: 5 10*6.[IU] via INTRAVENOUS
  Filled 2013-11-02: qty 5

## 2013-11-02 MED ORDER — PENICILLIN G POTASSIUM 5000000 UNITS IJ SOLR
5.0000 10*6.[IU] | Freq: Once | INTRAVENOUS | Status: DC
  Filled 2013-11-02: qty 5

## 2013-11-02 MED ORDER — PHENYLEPHRINE 40 MCG/ML (10ML) SYRINGE FOR IV PUSH (FOR BLOOD PRESSURE SUPPORT)
80.0000 ug | PREFILLED_SYRINGE | INTRAVENOUS | Status: DC | PRN
  Filled 2013-11-02: qty 2

## 2013-11-02 MED ORDER — LACTATED RINGERS IV SOLN
INTRAVENOUS | Status: DC
  Administered 2013-11-02: 22:00:00 via INTRAUTERINE

## 2013-11-02 MED ORDER — PENICILLIN G POTASSIUM 5000000 UNITS IJ SOLR
2.5000 10*6.[IU] | INTRAVENOUS | Status: DC
  Administered 2013-11-02 (×3): 2.5 10*6.[IU] via INTRAVENOUS
  Filled 2013-11-02 (×5): qty 2.5

## 2013-11-02 MED ORDER — DIPHENHYDRAMINE HCL 50 MG/ML IJ SOLN: 12.5000 mg | INTRAMUSCULAR | Status: DC | PRN

## 2013-11-02 MED ORDER — LIDOCAINE HCL (PF) 1 % IJ SOLN
INTRAMUSCULAR | Status: DC | PRN
  Administered 2013-11-02 (×4): 4 mL

## 2013-11-02 MED ORDER — FENTANYL 2.5 MCG/ML BUPIVACAINE 1/10 % EPIDURAL INFUSION (WH - ANES)
INTRAMUSCULAR | Status: AC
  Administered 2013-11-02: 14 mL/h via EPIDURAL
  Filled 2013-11-02: qty 125

## 2013-11-02 MED ORDER — LACTATED RINGERS IV SOLN
500.0000 mL | Freq: Once | INTRAVENOUS | Status: AC
  Administered 2013-11-02: 500 mL via INTRAVENOUS

## 2013-11-02 MED ORDER — OXYTOCIN 40 UNITS IN LACTATED RINGERS INFUSION - SIMPLE MED: 1.0000 m[IU]/min | INTRAVENOUS | Status: DC

## 2013-11-02 MED ORDER — PHENYLEPHRINE 40 MCG/ML (10ML) SYRINGE FOR IV PUSH (FOR BLOOD PRESSURE SUPPORT)
PREFILLED_SYRINGE | INTRAVENOUS | Status: AC
  Filled 2013-11-02: qty 10

## 2013-11-02 MED ORDER — OXYTOCIN 40 UNITS IN LACTATED RINGERS INFUSION - SIMPLE MED
1.0000 m[IU]/min | INTRAVENOUS | Status: DC
  Administered 2013-11-02: 2 m[IU]/min via INTRAVENOUS
  Filled 2013-11-02: qty 1000

## 2013-11-02 MED ORDER — EPHEDRINE 5 MG/ML INJ
INTRAVENOUS | Status: AC
  Filled 2013-11-02: qty 4

## 2013-11-02 NOTE — Progress Notes (Signed)
Shannon Jarvis applied to pt

## 2013-11-02 NOTE — Progress Notes (Addendum)
Subjective: [facility] [employer] [employer]    Objective: BP 135/85  Pulse 94  Temp(Src) 98.9 F (37.2 C) (Oral)  Resp 18  Ht 5\' 3"  (1.6 m)  Wt 216 lb (97.977 kg)  BMI 38.27 kg/m2  LMP 02/04/2013     FHT:  Category 1 UC:   Occasional, mild SVE:   Dilation: 1 Effacement (%): 60 Station: -1 Exam by:: V Kadajah Kjos CNM No HSV lesions or prodrome on vulvar exam or in vaginal vault or on cervix. Approx 1 cm area of induration under skin of vulva, just to the right of the introitus--appears c/w hydradenitis  Results for orders placed during the hospital encounter of 11/01/13 (from the past 24 hour(s))  PROTEIN / CREATININE RATIO, URINE     Status: Abnormal   Collection Time    11/01/13  6:32 PM      Result Value Range   Creatinine, Urine 186.19     Total Protein, Urine 56.8     PROTEIN CREATININE RATIO 0.31 (*) 0.00 - 0.15  CBC     Status: Abnormal   Collection Time    11/01/13  6:48 PM      Result Value Range   WBC 11.4 (*) 4.0 - 10.5 K/uL   RBC 3.85 (*) 3.87 - 5.11 MIL/uL   Hemoglobin 12.9  12.0 - 15.0 g/dL   HCT 16.137.1  09.636.0 - 04.546.0 %   MCV 96.4  78.0 - 100.0 fL   MCH 33.5  26.0 - 34.0 pg   MCHC 34.8  30.0 - 36.0 g/dL   RDW 40.913.2  81.111.5 - 91.415.5 %   Platelets 112 (*) 150 - 400 K/uL  COMPREHENSIVE METABOLIC PANEL     Status: Abnormal   Collection Time    11/01/13  6:48 PM      Result Value Range   Sodium 137  137 - 147 mEq/L   Potassium 4.7  3.7 - 5.3 mEq/L   Chloride 101  96 - 112 mEq/L   CO2 24  19 - 32 mEq/L   Glucose, Bld 76  70 - 99 mg/dL   BUN 12  6 - 23 mg/dL   Creatinine, Ser 7.820.63  0.50 - 1.10 mg/dL   Calcium 9.6  8.4 - 95.610.5 mg/dL   Total Protein 6.0  6.0 - 8.3 g/dL   Albumin 2.3 (*) 3.5 - 5.2 g/dL   AST 19  0 - 37 U/L   ALT 10  0 - 35 U/L   Alkaline Phosphatase 161 (*) 39 - 117 U/L   Total Bilirubin <0.2 (*) 0.3 - 1.2 mg/dL   GFR calc non Af Amer >90  >90 mL/min   GFR calc Af Amer >90  >90 mL/min  URIC ACID     Status: None   Collection Time    11/01/13  6:48 PM       Result Value Range   Uric Acid, Serum 6.0  2.4 - 7.0 mg/dL  LACTATE DEHYDROGENASE     Status: Abnormal   Collection Time    11/01/13  6:48 PM      Result Value Range   LDH 270 (*) 94 - 250 U/L     Assessment / Plan: IUP at 38 4/7 weeks Mild pre-eclampsia 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  Discussed R&B of C/S for recent HSV, although more than 2 weeks have passed since that outbreak (which was a  recurrence, not a primary event).  Advised patient we could not absolutely ensure no risk to the baby, but that the likelihood of HSV transmission is low.  Patient currently declines C/S. Dr. Normand Sloop updated. Cytotech 25 mcg placed per vagina. Per consult with Dr. Normand Sloop, will plan Magnesium administration after delivery, unless [facility] labs are abnormal on recheck in am, or if BP increases.  Nigel Bridgeman 11/02/2013, 12:48 AM

## 2013-11-02 NOTE — Progress Notes (Signed)
  Subjective: Comfortable, hungry.  Denies HA, visual sx, or epigastric pain.  Objective: BP 125/81  Pulse 88  Temp(Src) 98.3 F (36.8 C) (Oral)  Resp 18  Ht 5\' 3"  (1.6 m)  Wt 216 lb (97.977 kg)  BMI 38.27 kg/m2  LMP 02/04/2013      Filed Vitals:   11/01/13 2131 11/01/13 2214 11/02/13 0000 11/02/13 0421  BP: 129/84 137/89 135/85 125/81  Pulse: 85 92 94 88  Temp:  98.6 F (37 C) 98.9 F (37.2 C) 98.3 F (36.8 C)  TempSrc:  Oral Oral Oral  Resp:  18 18 18   Height:      Weight:        FHT: Category 1 UC:   Occasional, mild SVE:   Dilation: 1 Effacement (%): 70 Station: -1 Exam by:: V Cate Oravec CNM Cervix softer, small amount bloody show. 2nd dose Cytotech 25 mcg placed in posterior fornix  Assessment / Plan: Induction for pre-eclampsia Start pitocin at 8:30a Start GBS prophylaxis with initiation of pitocin. Per Dr. Normand Sloopillard, plan Mg sulfate administration pp, unless platelets have dropped or other labs abnormalities occur.  Chirstopher Iovino 11/02/2013, 4:25 AM

## 2013-11-02 NOTE — Progress Notes (Signed)
Shannon Jarvis tracing fhr

## 2013-11-02 NOTE — Progress Notes (Signed)
28 y.o. year old female,at 8367w4d gestation.  SUBJECTIVE:  Stable  OBJECTIVE:  BP 131/79  Pulse 93  Temp(Src) 98.1 F (36.7 C) (Oral)  Resp 20  Ht 5\' 3"  (1.6 m)  Wt 216 lb (97.977 kg)  BMI 38.27 kg/m2  LMP 02/04/2013  Fetal Heart Tones:  Category 1  Contractions:          Mild  ASSESSMENT:  4667w4d Weeks Pregnancy  Not in active labor  PLAN:  Continue Pitocin augmentation.  Leonard SchwartzArthur Vernon Yaslin Kirtley, M.D.

## 2013-11-02 NOTE — Progress Notes (Signed)
Report to Dr. Normand Sloopillard. BPs 125-137/81-89.  FHR Category 1 UCs occasional.  Platelet count 96 this am.  CMP, uric acid, and LDH pending.  Will request Anesthesia consult regarding low platelets. Will defer decision regarding timing of magnesium administration to Dr. Stefano GaulStringer.  Nigel BridgemanVicki Sakinah Rosamond, CNM 11/02/13 7a

## 2013-11-02 NOTE — Progress Notes (Signed)
Pt contracting too frequently for Pitocin

## 2013-11-02 NOTE — Progress Notes (Signed)
Monica tracing fhr intermittently. Cardio applied

## 2013-11-02 NOTE — Anesthesia Procedure Notes (Signed)
Epidural Patient location during procedure: OB Start time: 11/02/2013 2:38 PM  Staffing Performed by: anesthesiologist   Preanesthetic Checklist Completed: patient identified, site marked, surgical consent, pre-op evaluation, timeout performed, IV checked, risks and benefits discussed and monitors and equipment checked  Epidural Patient position: sitting Prep: site prepped and draped and DuraPrep Patient monitoring: continuous pulse ox and blood pressure Approach: midline Injection technique: LOR air  Needle:  Needle type: Tuohy  Needle gauge: 17 G Needle length: 9 cm and 9 Needle insertion depth: 6 cm Catheter type: closed end flexible Catheter size: 19 Gauge Catheter at skin depth: 11 cm Test dose: negative  Assessment Events: blood not aspirated, injection not painful, no injection resistance, negative IV test and no paresthesia  Additional Notes Discussed risk of headache, infection, bleeding, nerve injury and failed or incomplete block.  Patient voices understanding and wishes to proceed.  Epidural placed easily on first attempt.  No paresthesia.  Patient tolerated procedure well with no apparent complications.  Jasmine DecemberA> Contrina Orona, MDReason for block:procedure for pain

## 2013-11-02 NOTE — Anesthesia Preprocedure Evaluation (Signed)
Anesthesia Evaluation  Patient identified by MRN, date of birth, ID band Patient awake    Reviewed: Allergy & Precautions, H&P , NPO status , Patient's Chart, lab work & pertinent test results, reviewed documented beta blocker date and time   History of Anesthesia Complications Negative for: history of anesthetic complications  Airway Mallampati: III TM Distance: >3 FB Neck ROM: full    Dental  (+) Teeth Intact   Pulmonary neg pulmonary ROS, Current Smoker,  breath sounds clear to auscultation        Cardiovascular hypertension (preeclampsia ), Rhythm:regular Rate:Normal     Neuro/Psych negative neurological ROS  negative psych ROS   GI/Hepatic negative GI ROS, Neg liver ROS,   Endo/Other  Morbid obesity  Renal/GU negative Renal ROS     Musculoskeletal   Abdominal   Peds  Hematology negative hematology ROS (+) Thrombocytopenia - plt 97   Anesthesia Other Findings   Reproductive/Obstetrics (+) Pregnancy                           Anesthesia Physical Anesthesia Plan  ASA: III  Anesthesia Plan: Epidural   Post-op Pain Management:    Induction:   Airway Management Planned:   Additional Equipment:   Intra-op Plan:   Post-operative Plan:   Informed Consent: I have reviewed the patients History and Physical, chart, labs and discussed the procedure including the risks, benefits and alternatives for the proposed anesthesia with the patient or authorized representative who has indicated his/her understanding and acceptance.     Plan Discussed with:   Anesthesia Plan Comments:         Anesthesia Quick Evaluation

## 2013-11-03 ENCOUNTER — Encounter (HOSPITAL_COMMUNITY): Payer: Self-pay | Admitting: *Deleted

## 2013-11-03 LAB — CBC
HCT: 33.3 % — ABNORMAL LOW (ref 36.0–46.0)
HEMATOCRIT: 31.7 % — AB (ref 36.0–46.0)
HEMOGLOBIN: 10.8 g/dL — AB (ref 12.0–15.0)
HEMOGLOBIN: 11.4 g/dL — AB (ref 12.0–15.0)
MCH: 32.6 pg (ref 26.0–34.0)
MCH: 32.8 pg (ref 26.0–34.0)
MCHC: 34.1 g/dL (ref 30.0–36.0)
MCHC: 34.2 g/dL (ref 30.0–36.0)
MCV: 95.7 fL (ref 78.0–100.0)
MCV: 95.8 fL (ref 78.0–100.0)
PLATELETS: 98 10*3/uL — AB (ref 150–400)
Platelets: 96 10*3/uL — ABNORMAL LOW (ref 150–400)
RBC: 3.31 MIL/uL — ABNORMAL LOW (ref 3.87–5.11)
RBC: 3.48 MIL/uL — AB (ref 3.87–5.11)
RDW: 13.1 % (ref 11.5–15.5)
RDW: 13.2 % (ref 11.5–15.5)
WBC: 13.3 10*3/uL — ABNORMAL HIGH (ref 4.0–10.5)
WBC: 15.5 10*3/uL — AB (ref 4.0–10.5)

## 2013-11-03 LAB — COMPREHENSIVE METABOLIC PANEL
ALBUMIN: 1.9 g/dL — AB (ref 3.5–5.2)
ALT: 8 U/L (ref 0–35)
AST: 17 U/L (ref 0–37)
Alkaline Phosphatase: 126 U/L — ABNORMAL HIGH (ref 39–117)
BUN: 6 mg/dL (ref 6–23)
CALCIUM: 8.1 mg/dL — AB (ref 8.4–10.5)
CHLORIDE: 104 meq/L (ref 96–112)
CO2: 20 mEq/L (ref 19–32)
CREATININE: 0.66 mg/dL (ref 0.50–1.10)
GFR calc Af Amer: >90 mL/min (ref >90)
GFR calc non Af Amer: >90 mL/min (ref >90)
Glucose, Bld: 205 mg/dL — ABNORMAL HIGH (ref 70–99)
Potassium: 3.8 mEq/L (ref 3.7–5.3)
Sodium: 137 mEq/L (ref 137–147)
Total Protein: 5.1 g/dL — ABNORMAL LOW (ref 6.0–8.3)

## 2013-11-03 LAB — URIC ACID: URIC ACID, SERUM: 6 mg/dL (ref 2.4–7.0)

## 2013-11-03 LAB — LACTATE DEHYDROGENASE: LDH: 242 U/L (ref 94–250)

## 2013-11-03 LAB — MRSA PCR SCREENING: MRSA by PCR: NEGATIVE

## 2013-11-03 MED ORDER — MAGNESIUM SULFATE BOLUS VIA INFUSION
4.0000 g | Freq: Once | INTRAVENOUS | Status: DC
  Filled 2013-11-03: qty 500

## 2013-11-03 MED ORDER — FAMOTIDINE 20 MG PO TABS
20.0000 mg | ORAL_TABLET | Freq: Two times a day (BID) | ORAL | Status: DC
  Administered 2013-11-03: 20 mg via ORAL
  Filled 2013-11-03 (×3): qty 1

## 2013-11-03 MED ORDER — ZOLPIDEM TARTRATE 5 MG PO TABS: 5.0000 mg | ORAL_TABLET | Freq: Every evening | ORAL | Status: DC | PRN

## 2013-11-03 MED ORDER — DIBUCAINE 1 % RE OINT: 1.0000 "application " | TOPICAL_OINTMENT | RECTAL | Status: DC | PRN

## 2013-11-03 MED ORDER — SENNOSIDES-DOCUSATE SODIUM 8.6-50 MG PO TABS
2.0000 | ORAL_TABLET | ORAL | Status: DC
  Administered 2013-11-03: 2 via ORAL
  Filled 2013-11-03 (×2): qty 2

## 2013-11-03 MED ORDER — IBUPROFEN 600 MG PO TABS
600.0000 mg | ORAL_TABLET | Freq: Four times a day (QID) | ORAL | Status: DC
  Administered 2013-11-03 – 2013-11-04 (×6): 600 mg via ORAL
  Filled 2013-11-03 (×7): qty 1

## 2013-11-03 MED ORDER — BENZOCAINE-MENTHOL 20-0.5 % EX AERO
1.0000 "application " | INHALATION_SPRAY | CUTANEOUS | Status: DC | PRN
  Administered 2013-11-03: 1 via TOPICAL
  Filled 2013-11-03: qty 56

## 2013-11-03 MED ORDER — TETANUS-DIPHTH-ACELL PERTUSSIS 5-2.5-18.5 LF-MCG/0.5 IM SUSP
0.5000 mL | Freq: Once | INTRAMUSCULAR | Status: AC
  Administered 2013-11-04: 0.5 mL via INTRAMUSCULAR
  Filled 2013-11-03 (×2): qty 0.5

## 2013-11-03 MED ORDER — SIMETHICONE 80 MG PO CHEW
80.0000 mg | CHEWABLE_TABLET | ORAL | Status: DC | PRN
  Administered 2013-11-03 (×2): 80 mg via ORAL
  Filled 2013-11-03 (×2): qty 1

## 2013-11-03 MED ORDER — OXYCODONE-ACETAMINOPHEN 5-325 MG PO TABS
1.0000 | ORAL_TABLET | ORAL | Status: DC | PRN
  Administered 2013-11-03 – 2013-11-04 (×5): 2 via ORAL
  Filled 2013-11-03 (×5): qty 2

## 2013-11-03 MED ORDER — LACTATED RINGERS IV SOLN
INTRAVENOUS | Status: DC
  Administered 2013-11-03 (×2): via INTRAVENOUS

## 2013-11-03 MED ORDER — LANOLIN HYDROUS EX OINT: TOPICAL_OINTMENT | CUTANEOUS | Status: DC | PRN

## 2013-11-03 MED ORDER — ONDANSETRON HCL 4 MG/2ML IJ SOLN: 4.0000 mg | INTRAMUSCULAR | Status: DC | PRN

## 2013-11-03 MED ORDER — PRENATAL MULTIVITAMIN CH
1.0000 | ORAL_TABLET | Freq: Every day | ORAL | Status: DC
  Administered 2013-11-03: 1 via ORAL
  Filled 2013-11-03: qty 1

## 2013-11-03 MED ORDER — DIPHENHYDRAMINE HCL 25 MG PO CAPS: 25.0000 mg | ORAL_CAPSULE | Freq: Four times a day (QID) | ORAL | Status: DC | PRN

## 2013-11-03 MED ORDER — ONDANSETRON HCL 4 MG PO TABS: 4.0000 mg | ORAL_TABLET | ORAL | Status: DC | PRN

## 2013-11-03 MED ORDER — WITCH HAZEL-GLYCERIN EX PADS: 1.0000 "application " | MEDICATED_PAD | CUTANEOUS | Status: DC | PRN

## 2013-11-03 MED ORDER — MAGNESIUM SULFATE 40 G IN LACTATED RINGERS - SIMPLE
2.0000 g/h | INTRAVENOUS | Status: AC
  Administered 2013-11-03: 2 g/h via INTRAVENOUS
  Filled 2013-11-03: qty 500

## 2013-11-03 NOTE — Progress Notes (Signed)
11/03/13 1045  Clinical Encounter Type  Visited With Patient and family together (husband, baby Rosalyn GessGrayson)  Visit Type Initial;Spiritual support;Social support  Referral From Nurse  Spiritual Encounters  Spiritual Needs Emotional  Stress Factors  Patient Stress Factors Exhausted;Loss of control   Visited Shanda BumpsJessica and family per RN referral for support.  She was exhausted, but pleased to feel that breastfeeding is starting well.  Introduced Spiritual Care and chaplain availability as part of family's support team.  Please [place] as needed:  [phone].  Thank you.  [address] Cherry [address] CircleChaplain Tyanne Derocher [place], [place] [phone]

## 2013-11-03 NOTE — Anesthesia Postprocedure Evaluation (Signed)
Anesthesia Post Note  Patient: Shannon Jarvis  Procedure(s) Performed: * No procedures listed *  Anesthesia type: Epidural  Patient location: Mother/Baby  Post pain: Pain level controlled  Post assessment: Post-op Vital signs reviewed  Last Vitals:  Filed Vitals:   11/03/13 1805  BP: 139/89  Pulse:   Temp:   Resp:     Post vital signs: Reviewed  Level of consciousness:alert  Complications: No apparent anesthesia complications

## 2013-11-03 NOTE — Progress Notes (Signed)
Post Partum Day 1  Subjective: the patient complains of heartburn.  She denies headaches, blurred vision, and right upper quadrant tenderness.  She is annoyed by her blood pressure cuff and by the effects of magnesium.  Objective: Blood pressure 121/80, pulse 84, temperature 98.4 F (36.9 C), temperature source Oral, resp. rate 18, height 5\' 3"  (1.6 m), weight 216 lb 12.8 oz (98.34 kg), last menstrual period 02/04/2013, SpO2 99.00%, unknown if currently breastfeeding.  Physical Exam:  General: alert and no distress Lochia: appropriate Uterine Fundus: firm Incision: NA DVT Evaluation: No evidence of DVT seen on physical exam. Chest: Clear Heart: Regular rate and rhythm Reflexes: Normal  Results for orders placed during the hospital encounter of 11/01/13 (from the past 24 hour(s))  CBC     Status: Abnormal   Collection Time    11/03/13 12:25 AM      Result Value Range   WBC 15.5 (*) 4.0 - 10.5 K/uL   RBC 3.48 (*) 3.87 - 5.11 MIL/uL   Hemoglobin 11.4 (*) 12.0 - 15.0 g/dL   HCT 14.733.3 (*) 82.936.0 - 56.246.0 %   MCV 95.7  78.0 - 100.0 fL   MCH 32.8  26.0 - 34.0 pg   MCHC 34.2  30.0 - 36.0 g/dL   RDW 13.013.2  86.511.5 - 78.415.5 %   Platelets 98 (*) 150 - 400 K/uL  MRSA PCR SCREENING     Status: None   Collection Time    11/03/13  2:30 AM      Result Value Range   MRSA by PCR NEGATIVE  NEGATIVE  CBC     Status: Abnormal   Collection Time    11/03/13  5:00 AM      Result Value Range   WBC 13.3 (*) 4.0 - 10.5 K/uL   RBC 3.31 (*) 3.87 - 5.11 MIL/uL   Hemoglobin 10.8 (*) 12.0 - 15.0 g/dL   HCT 69.631.7 (*) 29.536.0 - 28.446.0 %   MCV 95.8  78.0 - 100.0 fL   MCH 32.6  26.0 - 34.0 pg   MCHC 34.1  30.0 - 36.0 g/dL   RDW 13.213.1  44.011.5 - 10.215.5 %   Platelets 96 (*) 150 - 400 K/uL  COMPREHENSIVE METABOLIC PANEL     Status: Abnormal   Collection Time    11/03/13  5:00 AM      Result Value Range   Sodium 137  137 - 147 mEq/L   Potassium 3.8  3.7 - 5.3 mEq/L   Chloride 104  96 - 112 mEq/L   CO2 20  19 - 32 mEq/L    Glucose, Bld 205 (*) 70 - 99 mg/dL   BUN 6  6 - 23 mg/dL   Creatinine, Ser 7.250.66  0.50 - 1.10 mg/dL   Calcium 8.1 (*) 8.4 - 10.5 mg/dL   Total Protein 5.1 (*) 6.0 - 8.3 g/dL   Albumin 1.9 (*) 3.5 - 5.2 g/dL   AST 17  0 - 37 U/L   ALT 8  0 - 35 U/L   Alkaline Phosphatase 126 (*) 39 - 117 U/L   Total Bilirubin <0.2 (*) 0.3 - 1.2 mg/dL   GFR calc non Af Amer >90  >90 mL/min   GFR calc Af Amer >90  >90 mL/min  LACTATE DEHYDROGENASE     Status: None   Collection Time    11/03/13  5:00 AM      Result Value Range   LDH 242  94 - 250 U/L  URIC ACID     Status: None   Collection Time    11/03/13  5:00 AM      Result Value Range   Uric Acid, Serum 6.0  2.4 - 7.0 mg/dL      Recent Labs  16/10/96 0025 11/03/13 0500  HGB 11.4* 10.8*  HCT 33.3* 31.7*    Assessment/Plan:  Postpartum day one spontaneous vaginal delivery Preeclampsia Thrombocytopenia Anemia Obesity Gastroesophageal reflux disease Elevated blood sugars  Plan:  Discontinue magnesium at 11 PM today.  Repeat labs in the morning.   LOS: 2 days   Shannon Jarvis V 11/03/2013, 11:11 PM

## 2013-11-03 NOTE — Lactation Note (Signed)
This note was copied from the chart of Shannon Quintin AltoJessica Throgmorton. Lactation Consultation Note    Initial consuslt with this mom of a term baby, now 3111 hours old. The baby has had 1 stool, and is breast feeding frequently. I assisted mom wiht latching and positioning for cross cradle hold. The baby latched easily, with strong suckles and visible swallows, strong breast pulls. I did teaching with mom from the baby and me book, on breast feeding. Lactation services reviewed. Hand expression taught - mom pleased to see she has colostrum. Mom knows to call for quetions/concerns.  Patient Name: Shannon Quintin AltoJessica Mika BJYNW'GToday's Date: 11/03/2013 Reason for consult: Initial assessment   Maternal Data Formula Feeding for Exclusion: Yes Reason for exclusion: Admission to Intensive Care Unit (ICU) post-partum (mom in AICU on magnesium drip) Infant to breast within first hour of birth: Yes Has patient been taught Hand Expression?: Yes Does the patient have breastfeeding experience prior to this delivery?: No  Feeding Feeding Type: Breast Fed Length of feed: 15 min  LATCH Score/Interventions Latch: Grasps breast easily, tongue down, lips flanged, rhythmical sucking. Intervention(s): Adjust position;Assist with latch;Breast massage;Breast compression  Audible Swallowing: A few with stimulation Intervention(s): Hand expression;Skin to skin  Type of Nipple: Everted at rest and after stimulation  Comfort (Breast/Nipple): Soft / non-tender     Hold (Positioning): Assistance needed to correctly position infant at breast and maintain latch. Intervention(s): Breastfeeding basics reviewed;Support Pillows;Position options;Skin to skin  LATCH Score: 8  Lactation Tools Discussed/Used     Consult Status Consult Status: Follow-up Date: 11/04/13 Follow-up type: In-patient    Alfred LevinsLee, Pearle Wandler Anne 11/03/2013, 11:02 AM

## 2013-11-04 LAB — CBC
HCT: 30.1 % — ABNORMAL LOW (ref 36.0–46.0)
Hemoglobin: 10.2 g/dL — ABNORMAL LOW (ref 12.0–15.0)
MCH: 33 pg (ref 26.0–34.0)
MCHC: 33.9 g/dL (ref 30.0–36.0)
MCV: 97.4 fL (ref 78.0–100.0)
PLATELETS: 97 10*3/uL — AB (ref 150–400)
RBC: 3.09 MIL/uL — AB (ref 3.87–5.11)
RDW: 13.4 % (ref 11.5–15.5)
WBC: 9.9 10*3/uL (ref 4.0–10.5)

## 2013-11-04 LAB — BASIC METABOLIC PANEL
BUN: 7 mg/dL (ref 6–23)
CO2: 24 meq/L (ref 19–32)
Calcium: 7.7 mg/dL — ABNORMAL LOW (ref 8.4–10.5)
Chloride: 106 mEq/L (ref 96–112)
Creatinine, Ser: 0.75 mg/dL (ref 0.50–1.10)
GFR calc Af Amer: >90 mL/min (ref >90)
Glucose, Bld: 82 mg/dL (ref 70–99)
Potassium: 4 mEq/L (ref 3.7–5.3)
SODIUM: 139 meq/L (ref 137–147)

## 2013-11-04 MED ORDER — IBUPROFEN 600 MG PO TABS: 600.0000 mg | ORAL_TABLET | Freq: Four times a day (QID) | ORAL | Status: DC | PRN

## 2013-11-04 MED ORDER — OXYCODONE-ACETAMINOPHEN 5-325 MG PO TABS: 1.0000 | ORAL_TABLET | Freq: Four times a day (QID) | ORAL | Status: DC | PRN

## 2013-11-04 NOTE — Progress Notes (Signed)
Pt discharged to home with significant other and mother.  Condition stable.  Pt ambulated to car with L. Ilsa IhaSnyder, NT.  No equipment for home ordered at discharge.

## 2013-11-04 NOTE — Progress Notes (Signed)
Transferring to [facility] room 309.

## 2013-11-04 NOTE — Discharge Summary (Signed)
Obstetric Discharge Summary Reason for Admission: induction of labor Prenatal Procedures: Preeclampsia and ultrasound Intrapartum Procedures: spontaneous vaginal delivery Postpartum Procedures: none platelets low but stable at 97,000, check again PP Complications-Operative and Postpartum: none Hemoglobin  Date Value Range Status  11/04/2013 10.2* 12.0 - 15.0 g/dL Final     HCT  Date Value Range Status  11/04/2013 30.1* 36.0 - 46.0 % Final   Pt attempting to breast feed but may supplement with bottle secondary to frequency of feeds.  She is undecided about BC.  Ready for discharge today.  BPs have been good and no BP med needed.  Physical Exam:  General: alert and no distress Lochia: appropriate Uterine Fundus: firm DVT Evaluation: No evidence of DVT seen on physical exam.  Discharge Diagnoses: Term Pregnancy-delivered  Discharge Information: Date: 11/04/2013 Activity: pelvic rest Diet: routine Medications: Ibuprofen and Percocet Condition: stable Instructions: refer to practice specific booklet Discharge to: home Follow-up Information   [facility] 6 [facility].   Specialty:  Obstetrics and Gynecology   Contact information:   [address] [place] [id number] [phone]      Newborn Data: Live born female  Birth Weight: 6 lb 10.7 oz (3025 g) APGAR: 9, 9  Home with mother.  Shannon Jarvis Y 11/04/2013, 11:53 AM

## 2013-11-04 NOTE — Lactation Note (Signed)
This note was copied from the chart of Boy Quintin AltoJessica Squibb. Lactation Consultation Note    Follow up consult with this mom and baby, now 37 hours post partum. The mom hs not slept in days and is exhausted. The baby is cluster feeding, and the mom wants to supplement with formula so she can get some rest. I had mom try side lying position, with dad sitting at the bedside to Assist, but this lasted a fes minutes. I did hand express about 1 mls of colostrum, which I spoon fed to the baby. I then gave mom formula with the [facility] feeding amounts chart to follow. The baby took 20 mls of formula, and then seemed satisfied.  I did explain to mom the effects adding formula at this time will have on her milk supply, and she understood. i also told her not to apologize to me about giving formula and using a pacifier. I told her it ws my job to fully inform her, and her job to choose a feeding plan for her baby, once informed. Formula teaching done with mom and dad. Mom knows to call for lactation help, after discharge, as needed.  Patient Name: Boy Shanda BumpsJessica Lozano ZOXWR'UToday's Date: 11/04/2013 Reason for consult: Follow-up assessment   Maternal Data    Feeding Feeding Type: Formula  LATCH Score/Interventions Latch: Repeated attempts needed to sustain latch, nipple held in mouth throughout feeding, stimulation needed to elicit sucking reflex. Intervention(s): Adjust position;Assist with latch;Breast massage;Breast compression  Audible Swallowing: None Intervention(s): Skin to skin;Hand expression  Type of Nipple: Everted at rest and after stimulation  Comfort (Breast/Nipple): Soft / non-tender     Hold (Positioning): Assistance needed to correctly position infant at breast and maintain latch. Intervention(s): Breastfeeding basics reviewed;Support Pillows;Position options;Skin to skin  LATCH Score: 6  Lactation Tools Discussed/Used     Consult Status Consult Status: Complete Follow-up type: Call as  needed    Alfred LevinsLee, Apostolos Blagg Anne 11/04/2013, 2:17 PM

## 2014-07-31 ENCOUNTER — Encounter (HOSPITAL_COMMUNITY): Payer: Self-pay | Admitting: *Deleted

## 2015-01-10 IMAGING — CT HEAD^HEAD (ADULT)
1 series · 16 of 29 positions shown, 20 images · non-contrast
Comparison: none

REASON FOR EXAM: left facial sensory change
COMMENTS:

[Series 2: soft tissue · axial · 0.48mm/px · z∈[-54,+76]mm · 16 of 29 slices shown, 20 images]
[im 2/29  brain]
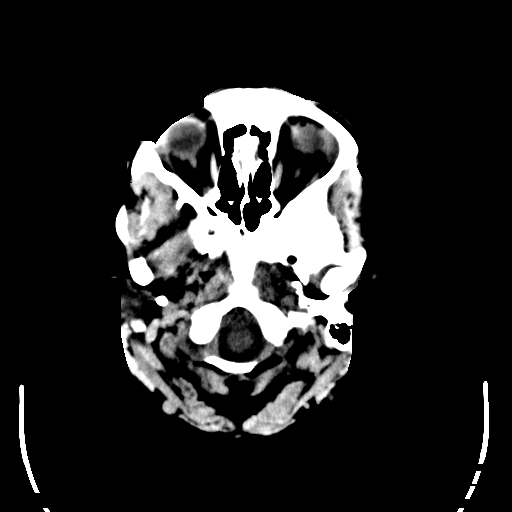
[im 2/29  bone]
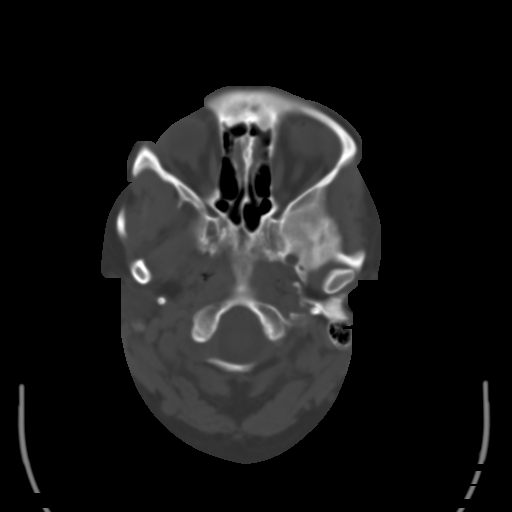
[im 4/29  brain]
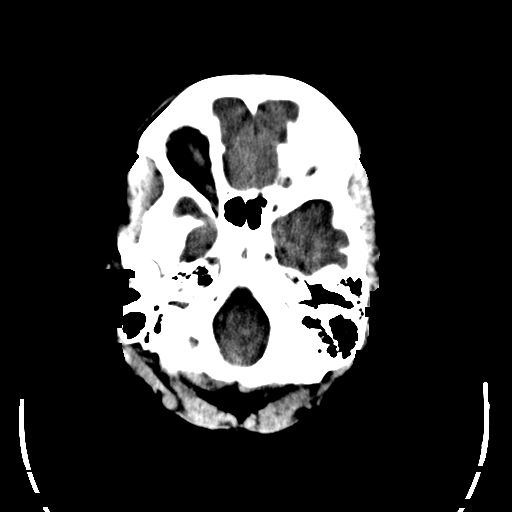
[im 6/29  brain]
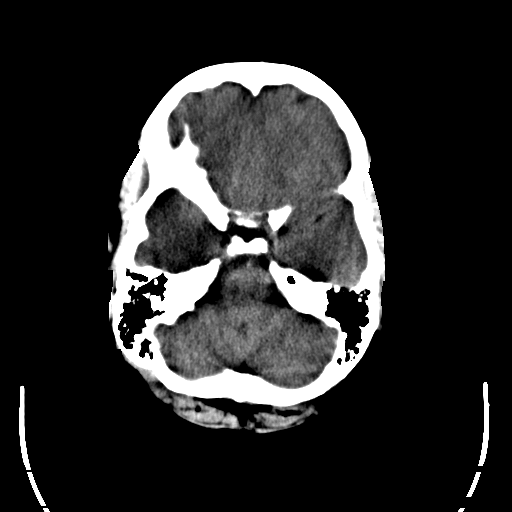
[im 7/29  brain]
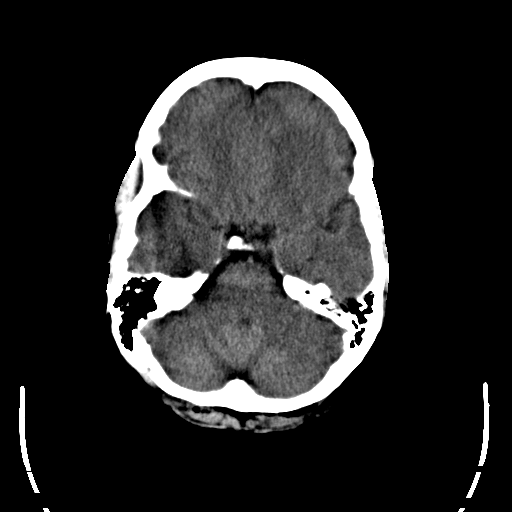
[im 9/29  brain]
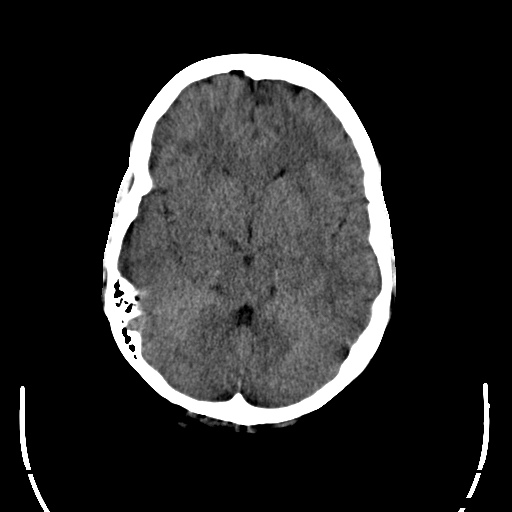
[im 9/29  bone]
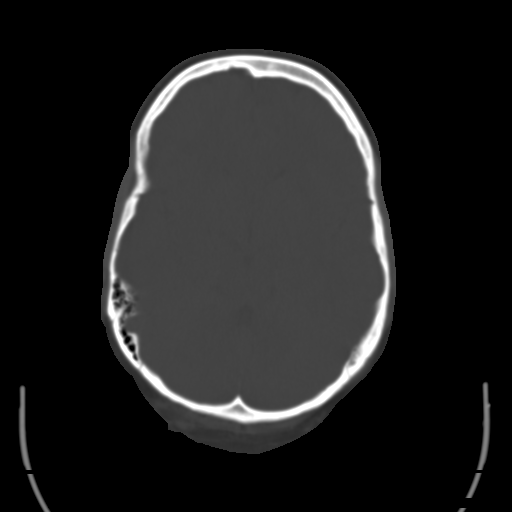
[im 11/29  brain]
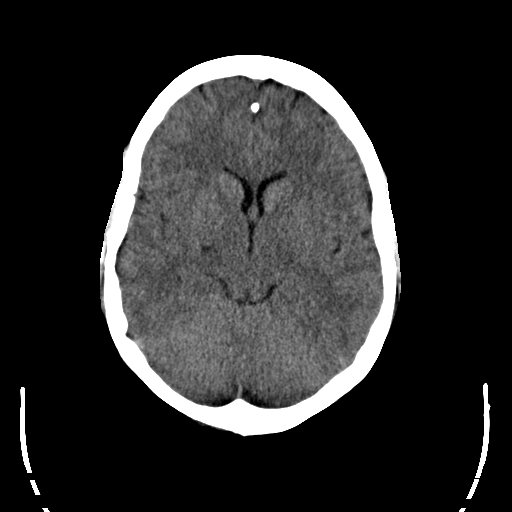
[im 12/29  brain]
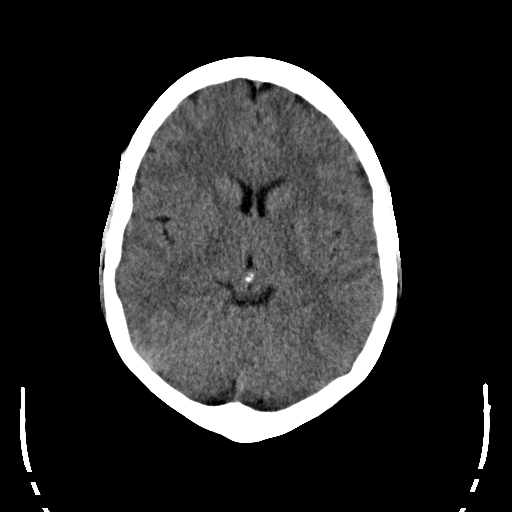
[im 14/29  brain]
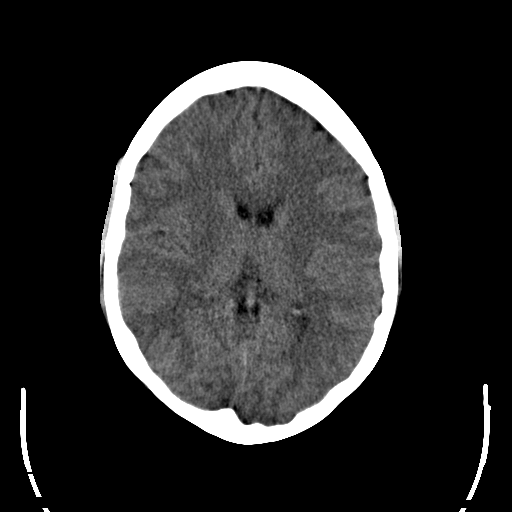
[im 16/29  brain]
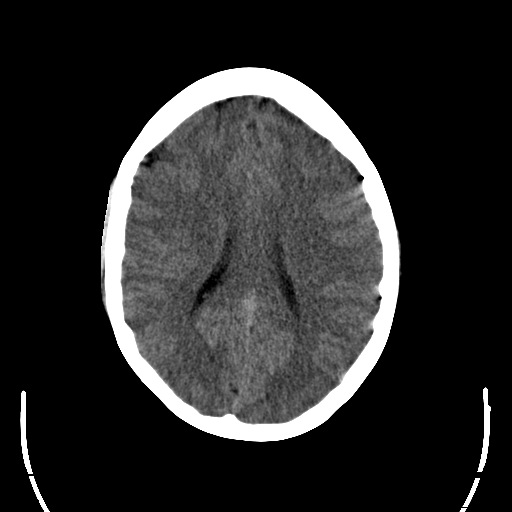
[im 16/29  bone]
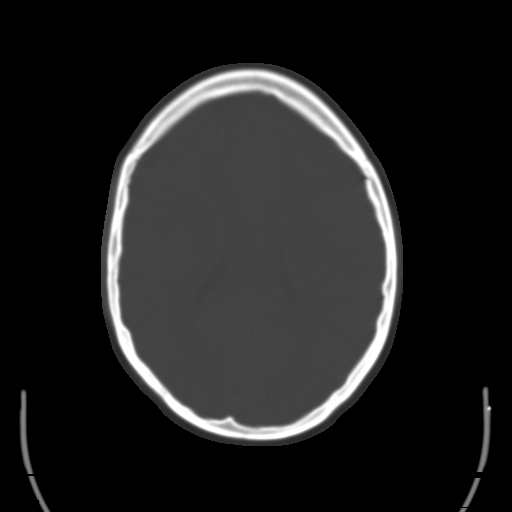
[im 18/29  brain]
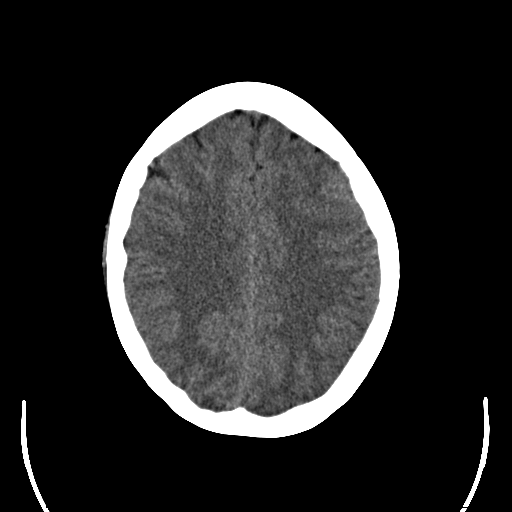
[im 19/29  brain]
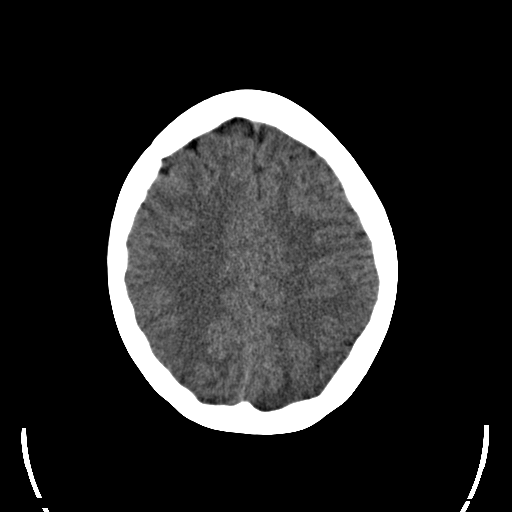
[im 21/29  brain]
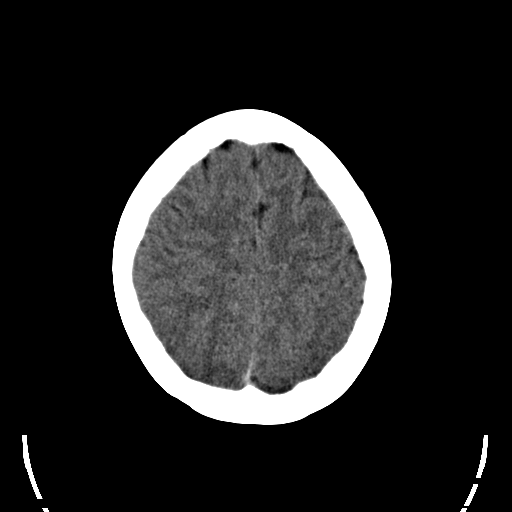
[im 23/29  brain]
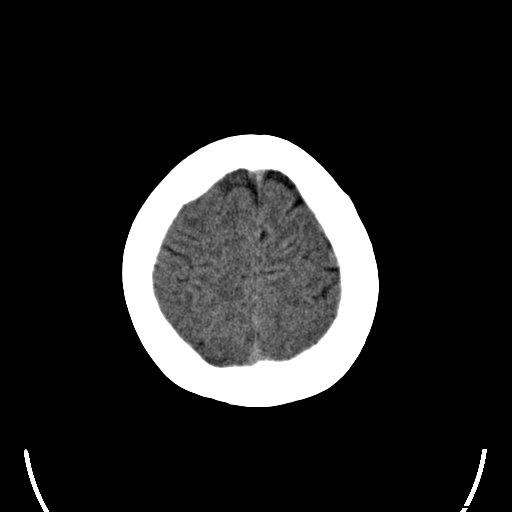
[im 23/29  bone]
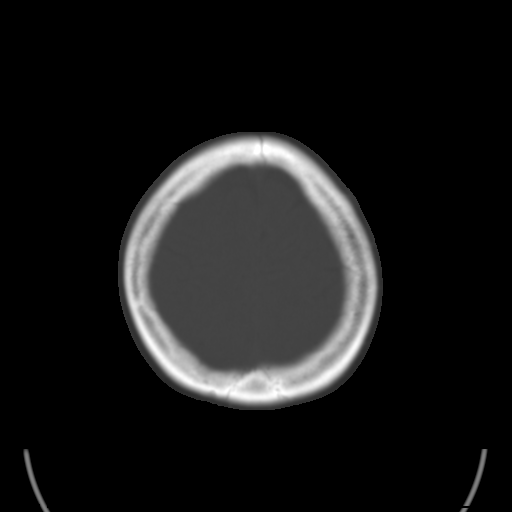
[im 24/29  brain]
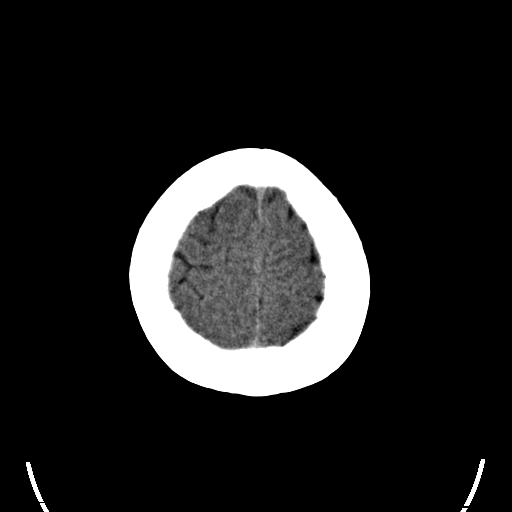
[im 26/29  brain]
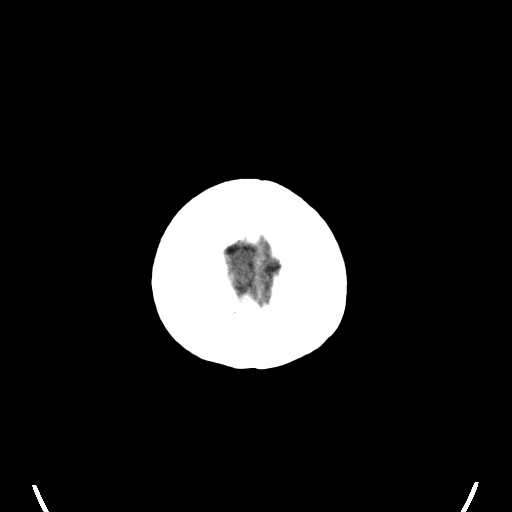
[im 28/29  brain]
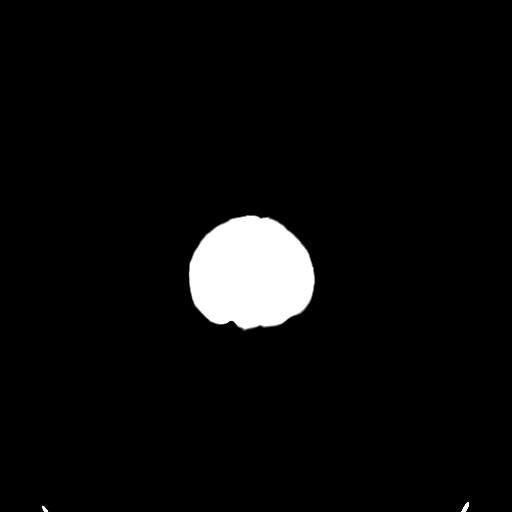

[16 of 29 positions shown; findings below may reference images not displayed]

PROCEDURE:     CT  - CT HEAD WITHOUT CONTRAST  - February 26, 2013 [DATE]

RESULT:     Noncontrast CT of the brain is performed. There is no previous
exam for comparison.

The ventricles and sulci are normal. There is no hemorrhage. There is no
focal mass, mass-effect or midline shift. There is no evidence of edema or
territorial infarct. The bone windows demonstrate normal aeration of the
paranasal sinuses and mastoid air cells. There is no skull fracture
demonstrated.
IMPRESSION: 1. No acute intracranial abnormality.

[REDACTED]

## 2015-07-10 HISTORY — PX: WISDOM TOOTH EXTRACTION: SHX21

## 2015-08-21 LAB — OB RESULTS CONSOLE GC/CHLAMYDIA
Chlamydia: NEGATIVE
GC PROBE AMP, GENITAL: NEGATIVE

## 2015-09-03 LAB — OB RESULTS CONSOLE HEPATITIS B SURFACE ANTIGEN: Hepatitis B Surface Ag: NEGATIVE

## 2015-09-03 LAB — OB RESULTS CONSOLE ABO/RH: RH Type: POSITIVE

## 2015-09-03 LAB — OB RESULTS CONSOLE RUBELLA ANTIBODY, IGM: Rubella: IMMUNE

## 2015-09-03 LAB — OB RESULTS CONSOLE PLATELET COUNT: Platelets: 151 10*3/uL

## 2015-09-03 LAB — OB RESULTS CONSOLE ANTIBODY SCREEN: ANTIBODY SCREEN: NEGATIVE

## 2015-09-03 LAB — OB RESULTS CONSOLE HGB/HCT, BLOOD: Hemoglobin: 14 g/dL

## 2015-09-03 LAB — OB RESULTS CONSOLE HIV ANTIBODY (ROUTINE TESTING): HIV: NONREACTIVE

## 2015-09-30 NOTE — L&D Delivery Note (Signed)
Delivery Note At 4:32 PM a viable female was delivered via  (Presentation: ROA ).  APGAR: 8,9 ; weight pending.  Placenta status: Intact, Spontaneous.  Cord:  with the following complications none.  Anesthesia:  Epidural  Episiotomy:  none Lacerations:  1st degree periurethral hemostatic, 1st degree perineal hemostatic. Suture Repair: none Est. Blood Loss (mL):  200mL  Mom to postpartum.  Baby to Couplet care / Skin to Skin.  Shannon MuseKate Jarvis 04/07/2016, 4:46 PM   I was present for this delivery and agree with the above resident's note.  LEFTWICH-KIRBY, Makayia Duplessis Certified Nurse-Midwife

## 2015-10-04 ENCOUNTER — Encounter: Payer: Self-pay | Admitting: *Deleted

## 2015-10-09 ENCOUNTER — Ambulatory Visit (INDEPENDENT_AMBULATORY_CARE_PROVIDER_SITE_OTHER): Payer: Medicaid Other | Admitting: Family Medicine

## 2015-10-09 ENCOUNTER — Encounter: Payer: Self-pay | Admitting: Family Medicine

## 2015-10-09 VITALS — BP 118/76 | HR 88 | Wt 167.0 lb

## 2015-10-09 DIAGNOSIS — Z3482 Encounter for supervision of other normal pregnancy, second trimester: Secondary | ICD-10-CM

## 2015-10-09 DIAGNOSIS — O09292 Supervision of pregnancy with other poor reproductive or obstetric history, second trimester: Secondary | ICD-10-CM

## 2015-10-09 DIAGNOSIS — B009 Herpesviral infection, unspecified: Secondary | ICD-10-CM

## 2015-10-09 DIAGNOSIS — Z3490 Encounter for supervision of normal pregnancy, unspecified, unspecified trimester: Secondary | ICD-10-CM | POA: Insufficient documentation

## 2015-10-09 DIAGNOSIS — L509 Urticaria, unspecified: Secondary | ICD-10-CM | POA: Insufficient documentation

## 2015-10-09 NOTE — Progress Notes (Signed)
Subjective:  Shannon Jarvis is a 30 y.o. G2P1001 at 7160w2d being seen today for transferring prenatal care from [facility].  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  Patient reports no complaints.  Contractions: Not present. Vag. Bleeding: None.  Movement: Absent. Denies leaking of fluid.   The following portions of the patient's history were reviewed and updated as appropriate: allergies, current medications, past family history, past medical history, past social history, past surgical history and problem list. Problem list updated.  Objective:   Filed Vitals:   10/09/15 1508  BP: 118/76  Pulse: 88  Weight: 167 lb (75.751 kg)    Fetal Status: Fetal Heart Rate (bpm): 158    Movement: Absent     General:  Alert, oriented and cooperative. Patient is in no acute distress.  Skin: Skin is warm and dry. No rash noted.   Cardiovascular: Normal heart rate noted  Respiratory: Normal respiratory effort, no problems with respiration noted  Abdomen: Soft, gravid, appropriate for gestational age. Pain/Pressure: Present     Pelvic: Vag. Bleeding: None Vag D/C Character: Thin   Cervical exam deferred        Extremities: Normal range of motion.  Edema: None  Mental Status: Normal mood and affect. Normal behavior. Normal judgment and thought content.    Assessment and Plan:  Pregnancy: G2P1001 at 7060w2d  1. Supervision of normal pregnancy, antepartum, second trimester Continue routine prenatal care. [facility]erral to [facility] Declines genetic testing. Order anatomy - US MFM OB COMP + 14 WK; Future  2. Urticaria Benadryl prn  3. History of pre-eclampsia in prior pregnancy, currently pregnant, second trimester Offered ASA she will consider  4. HSV  infection--10/11/13 Valtrex  General obstetric precautions including but not limited to vaginal bleeding, contractions, leaking of fluid and fetal movement were reviewed in detail with the patient. Please refer to After Visit Summary for other counseling recommendations.  Return in 4 weeks (on 11/06/2015).   Reva Boresanya S Leonila Speranza, MD

## 2015-10-09 NOTE — Patient Instructions (Signed)
Second Trimester of Pregnancy The second trimester is from week 13 through week 28, months 4 through 6. The second trimester is often a time when you feel your best. Your body has also adjusted to being pregnant, and you begin to feel better physically. Usually, morning sickness has lessened or quit completely, you may have more energy, and you may have an increase in appetite. The second trimester is also a time when the fetus is growing rapidly. At the end of the sixth month, the fetus is about 9 inches long and weighs about 1 pounds. You will likely begin to feel the baby move (quickening) between 18 and 20 weeks of the pregnancy. BODY CHANGES Your body goes through many changes during pregnancy. The changes vary from woman to woman.   Your weight will continue to increase. You will notice your lower abdomen bulging out.  You may begin to get stretch marks on your hips, abdomen, and breasts.  You may develop headaches that can be relieved by medicines approved by your health care provider.  You may urinate more often because the fetus is pressing on your bladder.  You may develop or continue to have heartburn as a result of your pregnancy.  You may develop constipation because certain hormones are causing the muscles that push waste through your intestines to slow down.  You may develop hemorrhoids or swollen, bulging veins (varicose veins).  You may have back pain because of the weight gain and pregnancy hormones relaxing your joints between the bones in your pelvis and as a result of a shift in weight and the muscles that support your balance.  Your breasts will continue to grow and be tender.  Your gums may bleed and may be sensitive to brushing and flossing.  Dark spots or blotches (chloasma, mask of pregnancy) may develop on your face. This will likely fade after the baby is born.  A dark line from your belly button to the pubic area (linea nigra) may appear. This will likely  fade after the baby is born.  You may have changes in your hair. These can include thickening of your hair, rapid growth, and changes in texture. Some women also have hair loss during or after pregnancy, or hair that feels dry or thin. Your hair will most likely return to normal after your baby is born. WHAT TO EXPECT AT YOUR PRENATAL VISITS During a routine prenatal visit:  You will be weighed to make sure you and the fetus are growing normally.  Your blood pressure will be taken.  Your abdomen will be measured to track your baby's growth.  The fetal heartbeat will be listened to.  Any test results from the previous visit will be discussed. Your health care provider may ask you:  How you are feeling.  If you are feeling the baby move.  If you have had any abnormal symptoms, such as leaking fluid, bleeding, severe headaches, or abdominal cramping.  If you are using any tobacco products, including cigarettes, chewing tobacco, and electronic cigarettes.  If you have any questions. Other tests that may be performed during your second trimester include:  Blood tests that check for:  Low iron levels (anemia).  Gestational diabetes (between 24 and 28 weeks).  Rh antibodies.  Urine tests to check for infections, diabetes, or protein in the urine.  An ultrasound to confirm the proper growth and development of the baby.  An amniocentesis to check for possible genetic problems.  Fetal screens for spina bifida   and Down syndrome.  HIV (human immunodeficiency virus) testing. Routine prenatal testing includes screening for HIV, unless you choose not to have this test. HOME CARE INSTRUCTIONS   Avoid all smoking, herbs, alcohol, and unprescribed drugs. These chemicals affect the formation and growth of the baby.  Do not use any tobacco products, including cigarettes, chewing tobacco, and electronic cigarettes. If you need help quitting, ask your health care provider. You may receive  counseling support and other resources to help you quit.  Follow your health care provider's instructions regarding medicine use. There are medicines that are either safe or unsafe to take during pregnancy.  Exercise only as directed by your health care provider. Experiencing uterine cramps is a good sign to stop exercising.  Continue to eat regular, healthy meals.  Wear a good support bra for breast tenderness.  Do not use hot tubs, steam rooms, or saunas.  Wear your seat belt at all times when driving.  Avoid raw meat, uncooked cheese, cat litter boxes, and soil used by cats. These carry germs that can cause birth defects in the baby.  Take your prenatal vitamins.  Take 1500-2000 mg of calcium daily starting at the 20th week of pregnancy until you deliver your baby.  Try taking a stool softener (if your health care provider approves) if you develop constipation. Eat more high-fiber foods, such as fresh vegetables or fruit and whole grains. Drink plenty of fluids to keep your urine clear or pale yellow.  Take warm sitz baths to soothe any pain or discomfort caused by hemorrhoids. Use hemorrhoid cream if your health care provider approves.  If you develop varicose veins, wear support hose. Elevate your feet for 15 minutes, 3-4 times a day. Limit salt in your diet.  Avoid heavy lifting, wear low heel shoes, and practice good posture.  Rest with your legs elevated if you have leg cramps or low back pain.  Visit your dentist if you have not gone yet during your pregnancy. Use a soft toothbrush to brush your teeth and be gentle when you floss.  A sexual relationship may be continued unless your health care provider directs you otherwise.  Continue to go to all your prenatal visits as directed by your health care provider. SEEK MEDICAL CARE IF:   You have dizziness.  You have mild pelvic cramps, pelvic pressure, or nagging pain in the abdominal area.  You have persistent nausea,  vomiting, or diarrhea.  You have a bad smelling vaginal discharge.  You have pain with urination. SEEK IMMEDIATE MEDICAL CARE IF:   You have a fever.  You are leaking fluid from your vagina.  You have spotting or bleeding from your vagina.  You have severe abdominal cramping or pain.  You have rapid weight gain or loss.  You have shortness of breath with chest pain.  You notice sudden or extreme swelling of your face, hands, ankles, feet, or legs.  You have not felt your baby move in over an hour.  You have severe headaches that do not go away with medicine.  You have vision changes.   This information is not intended to replace advice given to you by your health care provider. Make sure you discuss any questions you have with your health care provider.   Document Released: 09/09/2001 Document Revised: 10/06/2014 Document Reviewed: 11/16/2012 Elsevier Interactive Patient Education 2016 [employer]   Breastfeeding Deciding to breastfeed is one of the best choices you can make for you and your baby. A change   in hormones during pregnancy causes your breast tissue to grow and increases the number and size of your milk ducts. These hormones also allow proteins, sugars, and fats from your blood supply to make breast milk in your milk-producing glands. Hormones prevent breast milk from being released before your baby is born as well as prompt milk flow after birth. Once breastfeeding has begun, thoughts of your baby, as well as his or her sucking or crying, can stimulate the release of milk from your milk-producing glands.  BENEFITS OF BREASTFEEDING For Your Baby  Your first milk (colostrum) helps your baby's digestive system function better.  There are antibodies in your milk that help your baby fight off infections.  Your baby has a lower incidence of asthma, allergies, and sudden infant death syndrome.  The nutrients in breast milk are better for your baby than infant  formulas and are designed uniquely for your baby's needs.  Breast milk improves your baby's brain development.  Your baby is less likely to develop other conditions, such as childhood obesity, asthma, or type 2 diabetes mellitus. For You  Breastfeeding helps to create a very special bond between you and your baby.  Breastfeeding is convenient. Breast milk is always available at the correct temperature and costs nothing.  Breastfeeding helps to burn calories and helps you lose the weight gained during pregnancy.  Breastfeeding makes your uterus contract to its prepregnancy size faster and slows bleeding (lochia) after you give birth.   Breastfeeding helps to lower your risk of developing type 2 diabetes mellitus, osteoporosis, and breast or ovarian cancer later in life. SIGNS THAT YOUR BABY IS HUNGRY Early Signs of Hunger  Increased alertness or activity.  Stretching.  Movement of the head from side to side.  Movement of the head and opening of the mouth when the corner of the mouth or cheek is stroked (rooting).  Increased sucking sounds, smacking lips, cooing, sighing, or squeaking.  Hand-to-mouth movements.  Increased sucking of fingers or hands. Late Signs of Hunger  Fussing.  Intermittent crying. Extreme Signs of Hunger Signs of extreme hunger will require calming and consoling before your baby will be able to breastfeed successfully. Do not wait for the following signs of extreme hunger to occur before you initiate breastfeeding:  Restlessness.  A loud, strong cry.  Screaming. BREASTFEEDING BASICS Breastfeeding Initiation  Find a comfortable place to sit or lie down, with your neck and back well supported.  Place a pillow or rolled up blanket under your baby to bring him or her to the level of your breast (if you are seated). Nursing pillows are specially designed to help support your arms and your baby while you breastfeed.  Make sure that your baby's  abdomen is facing your abdomen.  Gently massage your breast. With your fingertips, massage from your chest wall toward your nipple in a circular motion. This encourages milk flow. You may need to continue this action during the feeding if your milk flows slowly.  Support your breast with 4 fingers underneath and your thumb above your nipple. Make sure your fingers are well away from your nipple and your baby's mouth.  Stroke your baby's lips gently with your finger or nipple.  When your baby's mouth is open wide enough, quickly bring your baby to your breast, placing your entire nipple and as much of the colored area around your nipple (areola) as possible into your baby's mouth.  More areola should be visible above your baby's upper lip than   below the lower lip.  Your baby's tongue should be between his or her lower gum and your breast.  Ensure that your baby's mouth is correctly positioned around your nipple (latched). Your baby's lips should create a seal on your breast and be turned out (everted).  It is common for your baby to suck about 2-3 minutes in order to start the flow of breast milk. Latching Teaching your baby how to latch on to your breast properly is very important. An improper latch can cause nipple pain and decreased milk supply for you and poor weight gain in your baby. Also, if your baby is not latched onto your nipple properly, he or she may swallow some air during feeding. This can make your baby fussy. Burping your baby when you switch breasts during the feeding can help to get rid of the air. However, teaching your baby to latch on properly is still the best way to prevent fussiness from swallowing air while breastfeeding. Signs that your baby has successfully latched on to your nipple:  Silent tugging or silent sucking, without causing you pain.  Swallowing heard between every 3-4 sucks.  Muscle movement above and in front of his or her ears while sucking. Signs  that your baby has not successfully latched on to nipple:  Sucking sounds or smacking sounds from your baby while breastfeeding.  Nipple pain. If you think your baby has not latched on correctly, slip your finger into the corner of your baby's mouth to break the suction and place it between your baby's gums. Attempt breastfeeding initiation again. Signs of Successful Breastfeeding Signs from your baby:  A gradual decrease in the number of sucks or complete cessation of sucking.  Falling asleep.  Relaxation of his or her body.  Retention of a small amount of milk in his or her mouth.  Letting go of your breast by himself or herself. Signs from you:  Breasts that have increased in firmness, weight, and size 1-3 hours after feeding.  Breasts that are softer immediately after breastfeeding.  Increased milk volume, as well as a change in milk consistency and color by the fifth day of breastfeeding.  Nipples that are not sore, cracked, or bleeding. Signs That Your Baby is Getting Enough Milk  Wetting at least 3 diapers in a 24-hour period. The urine should be clear and pale yellow by age 5 days.  At least 3 stools in a 24-hour period by age 5 days. The stool should be soft and yellow.  At least 3 stools in a 24-hour period by age 7 days. The stool should be seedy and yellow.  No loss of weight greater than 10% of birth weight during the first 3 days of age.  Average weight gain of 4-7 ounces (113-198 g) per week after age 4 days.  Consistent daily weight gain by age 5 days, without weight loss after the age of 2 weeks. After a feeding, your baby may spit up a small amount. This is common. BREASTFEEDING FREQUENCY AND DURATION Frequent feeding will help you make more milk and can prevent sore nipples and breast engorgement. Breastfeed when you feel the need to reduce the fullness of your breasts or when your baby shows signs of hunger. This is called "breastfeeding on demand." Avoid  introducing a pacifier to your baby while you are working to establish breastfeeding (the first 4-6 weeks after your baby is born). After this time you may choose to use a pacifier. Research has shown that   pacifier use during the first year of a baby's life decreases the risk of sudden infant death syndrome (SIDS). Allow your baby to feed on each breast as long as he or she wants. Breastfeed until your baby is finished feeding. When your baby unlatches or falls asleep while feeding from the first breast, offer the second breast. Because newborns are often sleepy in the first few weeks of life, you may need to awaken your baby to get him or her to feed. Breastfeeding times will vary from baby to baby. However, the following rules can serve as a guide to help you ensure that your baby is properly fed:  Newborns (babies 4 weeks of age or younger) may breastfeed every 1-3 hours.  Newborns should not go longer than 3 hours during the day or 5 hours during the night without breastfeeding.  You should breastfeed your baby a minimum of 8 times in a 24-hour period until you begin to introduce solid foods to your baby at around 6 months of age. BREAST MILK PUMPING Pumping and storing breast milk allows you to ensure that your baby is exclusively fed your breast milk, even at times when you are unable to breastfeed. This is especially important if you are going back to work while you are still breastfeeding or when you are not able to be present during feedings. Your lactation consultant can give you guidelines on how long it is safe to store breast milk. A breast pump is a machine that allows you to pump milk from your breast into a sterile bottle. The pumped breast milk can then be stored in a refrigerator or freezer. Some breast pumps are operated by hand, while others use electricity. Ask your lactation consultant which type will work best for you. Breast pumps can be purchased, but some hospitals and  breastfeeding support groups lease breast pumps on a monthly basis. A lactation consultant can teach you how to hand express breast milk, if you prefer not to use a pump. CARING FOR YOUR BREASTS WHILE YOU BREASTFEED Nipples can become dry, cracked, and sore while breastfeeding. The following recommendations can help keep your breasts moisturized and healthy:  Avoid using soap on your nipples.  Wear a supportive bra. Although not required, special nursing bras and tank tops are designed to allow access to your breasts for breastfeeding without taking off your entire bra or top. Avoid wearing underwire-style bras or extremely tight bras.  Air dry your nipples for 3-4minutes after each feeding.  Use only cotton bra pads to absorb leaked breast milk. Leaking of breast milk between feedings is normal.  Use lanolin on your nipples after breastfeeding. Lanolin helps to maintain your skin's normal moisture barrier. If you use pure lanolin, you do not need to wash it off before feeding your baby again. Pure lanolin is not toxic to your baby. You may also hand express a few drops of breast milk and gently massage that milk into your nipples and allow the milk to air dry. In the first few weeks after giving birth, some women experience extremely full breasts (engorgement). Engorgement can make your breasts feel heavy, warm, and tender to the touch. Engorgement peaks within 3-5 days after you give birth. The following recommendations can help ease engorgement:  Completely empty your breasts while breastfeeding or pumping. You may want to start by applying warm, moist heat (in the shower or with warm water-soaked hand towels) just before feeding or pumping. This increases circulation and helps the milk   flow. If your baby does not completely empty your breasts while breastfeeding, pump any extra milk after he or she is finished.  Wear a snug bra (nursing or regular) or tank top for 1-2 days to signal your body  to slightly decrease milk production.  Apply ice packs to your breasts, unless this is too uncomfortable for you.  Make sure that your baby is latched on and positioned properly while breastfeeding. If engorgement persists after 48 hours of following these recommendations, contact your health care provider or a lactation consultant. OVERALL HEALTH CARE RECOMMENDATIONS WHILE BREASTFEEDING  Eat healthy foods. Alternate between meals and snacks, eating 3 of each per day. Because what you eat affects your breast milk, some of the foods may make your baby more irritable than usual. Avoid eating these foods if you are sure that they are negatively affecting your baby.  Drink milk, fruit juice, and water to satisfy your thirst (about 10 glasses a day).  Rest often, relax, and continue to take your prenatal vitamins to prevent fatigue, stress, and anemia.  Continue breast self-awareness checks.  Avoid chewing and smoking tobacco. Chemicals from cigarettes that pass into breast milk and exposure to secondhand smoke may harm your baby.  Avoid alcohol and drug use, including marijuana. Some medicines that may be harmful to your baby can pass through breast milk. It is important to ask your health care provider before taking any medicine, including all over-the-counter and prescription medicine as well as vitamin and herbal supplements. It is possible to become pregnant while breastfeeding. If birth control is desired, ask your health care provider about options that will be safe for your baby. SEEK MEDICAL CARE IF:  You feel like you want to stop breastfeeding or have become frustrated with breastfeeding.  You have painful breasts or nipples.  Your nipples are cracked or bleeding.  Your breasts are red, tender, or warm.  You have a swollen area on either breast.  You have a fever or chills.  You have nausea or vomiting.  You have drainage other than breast milk from your nipples.  Your  breasts do not become full before feedings by the fifth day after you give birth.  You feel sad and depressed.  Your baby is too sleepy to eat well.  Your baby is having trouble sleeping.   Your baby is wetting less than 3 diapers in a 24-hour period.  Your baby has less than 3 stools in a 24-hour period.  Your baby's skin or the white part of his or her eyes becomes yellow.   Your baby is not gaining weight by 5 days of age. SEEK IMMEDIATE MEDICAL CARE IF:  Your baby is overly tired (lethargic) and does not want to wake up and feed.  Your baby develops an unexplained fever.   This information is not intended to replace advice given to you by your health care provider. Make sure you discuss any questions you have with your health care provider.   Document Released: 09/15/2005 Document Revised: 06/06/2015 Document Reviewed: 03/09/2013 Elsevier Interactive Patient Education 2016 [employer] Inc.  

## 2015-10-22 ENCOUNTER — Telehealth: Payer: Self-pay | Admitting: *Deleted

## 2015-10-22 NOTE — Telephone Encounter (Signed)
Received a phone note from the Triage nurse on Friday, pt had called c/o cramping and one episode of light spotting. Pt states she is feeling better and has not had anymore episodes since then.  Encouraged fluid intake and Tylenol for discomfort.  Pt acknowledged and will call back with any changes.

## 2015-11-06 ENCOUNTER — Other Ambulatory Visit: Payer: Medicaid Other | Admitting: *Deleted

## 2015-11-06 NOTE — Progress Notes (Signed)
Pt currently on Baby Scripts and is coming every 8 weeks at this time, pt c/o one episode of light spotting that has resolved but would like to check FHT for reassurance.  FHR 147 by fetal doppler.  Reviewed bleeding precautions with the pt, will follow-up at [facility] for Baby Script schedule.

## 2015-11-28 ENCOUNTER — Ambulatory Visit (HOSPITAL_COMMUNITY)
Admission: RE | Admit: 2015-11-28 | Discharge: 2015-11-28 | Disposition: A | Discharge status: Home / Self Care | Payer: Medicaid Other | Source: Ambulatory Visit | Attending: Family Medicine | Admitting: Family Medicine

## 2015-11-28 DIAGNOSIS — O98512 Other viral diseases complicating pregnancy, second trimester: Secondary | ICD-10-CM | POA: Insufficient documentation

## 2015-11-28 DIAGNOSIS — Z36 Encounter for antenatal screening of mother: Secondary | ICD-10-CM | POA: Insufficient documentation

## 2015-11-28 DIAGNOSIS — Z3482 Encounter for supervision of other normal pregnancy, second trimester: Secondary | ICD-10-CM

## 2015-11-28 DIAGNOSIS — Z3A19 19 weeks gestation of pregnancy: Secondary | ICD-10-CM | POA: Insufficient documentation

## 2015-11-28 DIAGNOSIS — O09292 Supervision of pregnancy with other poor reproductive or obstetric history, second trimester: Secondary | ICD-10-CM | POA: Diagnosis not present

## 2015-11-28 DIAGNOSIS — B009 Herpesviral infection, unspecified: Secondary | ICD-10-CM

## 2015-11-28 DIAGNOSIS — Z3689 Encounter for other specified antenatal screening: Secondary | ICD-10-CM

## 2015-11-29 ENCOUNTER — Other Ambulatory Visit: Payer: Self-pay | Admitting: Family Medicine

## 2015-11-29 DIAGNOSIS — Z3689 Encounter for other specified antenatal screening: Secondary | ICD-10-CM

## 2015-11-29 DIAGNOSIS — O98512 Other viral diseases complicating pregnancy, second trimester: Secondary | ICD-10-CM

## 2015-11-29 DIAGNOSIS — B009 Herpesviral infection, unspecified: Secondary | ICD-10-CM

## 2015-11-29 DIAGNOSIS — Z3A19 19 weeks gestation of pregnancy: Secondary | ICD-10-CM

## 2015-11-29 DIAGNOSIS — O09292 Supervision of pregnancy with other poor reproductive or obstetric history, second trimester: Secondary | ICD-10-CM

## 2015-12-04 ENCOUNTER — Encounter (INDEPENDENT_AMBULATORY_CARE_PROVIDER_SITE_OTHER): Payer: Self-pay

## 2015-12-04 ENCOUNTER — Encounter: Payer: Medicaid Other | Admitting: Obstetrics & Gynecology

## 2015-12-04 ENCOUNTER — Encounter: Payer: Self-pay | Admitting: Obstetrics & Gynecology

## 2015-12-04 ENCOUNTER — Ambulatory Visit (INDEPENDENT_AMBULATORY_CARE_PROVIDER_SITE_OTHER): Payer: Medicaid Other | Admitting: Obstetrics & Gynecology

## 2015-12-04 VITALS — BP 122/77 | HR 89 | Wt 171.0 lb

## 2015-12-04 DIAGNOSIS — B009 Herpesviral infection, unspecified: Secondary | ICD-10-CM

## 2015-12-04 DIAGNOSIS — O09292 Supervision of pregnancy with other poor reproductive or obstetric history, second trimester: Secondary | ICD-10-CM

## 2015-12-04 DIAGNOSIS — Z7189 Other specified counseling: Secondary | ICD-10-CM

## 2015-12-04 DIAGNOSIS — Z7185 Encounter for immunization safety counseling: Secondary | ICD-10-CM

## 2015-12-04 DIAGNOSIS — Z3482 Encounter for supervision of other normal pregnancy, second trimester: Secondary | ICD-10-CM

## 2015-12-04 DIAGNOSIS — N907 Vulvar cyst: Secondary | ICD-10-CM

## 2015-12-04 MED ORDER — ASPIRIN EC 81 MG PO TBEC: 81.0000 mg | DELAYED_RELEASE_TABLET | Freq: Every day | ORAL | Status: DC

## 2015-12-04 NOTE — Progress Notes (Signed)
Subjective:  Shannon Jarvis is a 30 y.o. G2P1001 at 4373w2d being seen today for ongoing prenatal care.  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  Patient reports constipation; sciatic nerve pain..  Contractions: Not present. Vag. Bleeding: None.  Movement: Present. Denies leaking of fluid.   The following portions of the patient's history were reviewed and updated as appropriate: allergies, current medications, past family history, past medical history, past social history, past surgical history and problem list. Problem list updated.  Objective:   Filed Vitals:   12/04/15 1305  BP: 122/77  Pulse: 89  Weight: 171 lb (77.565 kg)    Fetal Status: Fetal Heart Rate (bpm): 144 Fundal Height: 20 cm Movement: Present     General:  Alert, oriented and cooperative. Patient is in no acute distress.  Skin: Skin is warm and dry. No rash noted.   Cardiovascular: Normal heart rate noted  Respiratory: Normal respiratory effort, no problems with respiration noted  Abdomen: Soft, gravid, appropriate for gestational age. Pain/Pressure: Present     Pelvic: Vag. Bleeding: None Vag D/C Character: Thin   Cervical exam deferred        Extremities: Normal range of motion.  Edema: None  Mental Status: Normal mood and affect. Normal behavior. Normal judgment and thought content.   Urinalysis: Urine Protein: Trace Urine Glucose: Negative  Assessment and Plan:  Pregnancy: G2P1001 at 6873w2d  1. Vaccine counseling - Flu Vaccine QUAD 36+ mos IM (Fluarix, Quad PF)  2. Supervision of normal pregnancy, antepartum, second trimester  - US MFM OB FOLLOW UP; Future to complete anatomy  3. Labial cyst  4. 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 History of pre-eclampsia in prior pregnancy, currently  pregnant, second trimester  - aspirin EC 81 MG tablet; Take 1 tablet (81 mg total) by mouth daily.  Dispense: 60 tablet; Refill: 2  Constipation: rec Miralax prn  Pt reports that she does not want to do Babyscripts.  She says that she is too worried and that the system was 'patchy' to her.  Preterm labor symptoms and general obstetric precautions including but not limited to vaginal bleeding, contractions, leaking of fluid and fetal movement were reviewed in detail with the patient. Please refer to After Visit Summary for other counseling recommendations.  Return in about 4 weeks (around 01/01/2016).   Willodean Rosenthalarolyn Harraway-Smith, MD

## 2015-12-04 NOTE — Patient Instructions (Signed)
Sciatica With Rehab The sciatic nerve runs from the back down the leg and is responsible for sensation and control of the muscles in the back (posterior) side of the thigh, lower leg, and foot. Sciatica is a condition that is characterized by inflammation of this nerve.  SYMPTOMS   Signs of nerve damage, including numbness and/or weakness along the posterior side of the lower extremity.  Pain in the back of the thigh that may also travel down the leg.  Pain that worsens when sitting for long periods of time.  Occasionally, pain in the back or buttock. CAUSES  Inflammation of the sciatic nerve is the cause of sciatica. The inflammation is due to something irritating the nerve. Common sources of irritation include:  Sitting for long periods of time.  Direct trauma to the nerve.  Arthritis of the spine.  Herniated or ruptured disk.  Slipping of the vertebrae (spondylolisthesis).  Pressure from soft tissues, such as muscles or ligament-like tissue (fascia). RISK INCREASES WITH:  Sports that place pressure or stress on the spine (football or weightlifting).  Poor strength and flexibility.  Failure to warm up properly before activity.  Family history of low back pain or disk disorders.  Previous back injury or surgery.  Poor body mechanics, especially when lifting, or poor posture. PREVENTION   Warm up and stretch properly before activity.  Maintain physical fitness:  Strength, flexibility, and endurance.  Cardiovascular fitness.  Learn and use proper technique, especially with posture and lifting. When possible, have coach correct improper technique.  Avoid activities that place stress on the spine. PROGNOSIS If treated properly, then sciatica usually resolves within 6 weeks. However, occasionally surgery is necessary.  RELATED COMPLICATIONS   Permanent nerve damage, including pain, numbness, tingle, or weakness.  Chronic back pain.  Risks of surgery: infection,  bleeding, nerve damage, or damage to surrounding tissues. TREATMENT Treatment initially involves resting from any activities that aggravate your symptoms. The use of ice and medication may help reduce pain and inflammation. The use of strengthening and stretching exercises may help reduce pain with activity. These exercises may be performed at home or with referral to a therapist. A therapist may recommend further treatments, such as transcutaneous electronic nerve stimulation (TENS) or ultrasound. Your caregiver may recommend corticosteroid injections to help reduce inflammation of the sciatic nerve. If symptoms persist despite non-surgical (conservative) treatment, then surgery may be recommended. MEDICATION  If pain medication is necessary, then nonsteroidal anti-inflammatory medications, such as aspirin and ibuprofen, or other minor pain relievers, such as acetaminophen, are often recommended.  Do not take pain medication for 7 days before surgery.  Prescription pain relievers may be given if deemed necessary by your caregiver. Use only as directed and only as much as you need.  Ointments applied to the skin may be helpful.  Corticosteroid injections may be given by your caregiver. These injections should be reserved for the most serious cases, because they may only be given a certain number of times. HEAT AND COLD  Cold treatment (icing) relieves pain and reduces inflammation. Cold treatment should be applied for 10 to 15 minutes every 2 to 3 hours for inflammation and pain and immediately after any activity that aggravates your symptoms. Use ice packs or massage the area with a piece of ice (ice massage).  Heat treatment may be used prior to performing the stretching and strengthening activities prescribed by your caregiver, physical therapist, or athletic trainer. Use a heat pack or soak the injury in warm water.   SEEK MEDICAL CARE IF:  Treatment seems to offer no benefit, or the condition  worsens.  Any medications produce adverse side effects. EXERCISES  RANGE OF MOTION (ROM) AND STRETCHING EXERCISES - Sciatica Most people with sciatic will find that their symptoms worsen with either excessive bending forward (flexion) or arching at the low back (extension). The exercises which will help resolve your symptoms will focus on the opposite motion. Your physician, physical therapist or athletic trainer will help you determine which exercises will be most helpful to resolve your low back pain. Do not complete any exercises without first consulting with your clinician. Discontinue any exercises which worsen your symptoms until you speak to your clinician. If you have pain, numbness or tingling which travels down into your buttocks, leg or foot, the goal of the therapy is for these symptoms to move closer to your back and eventually resolve. Occasionally, these leg symptoms will get better, but your low back pain may worsen; this is typically an indication of progress in your rehabilitation. Be certain to be very alert to any changes in your symptoms and the activities in which you participated in the 24 hours prior to the change. Sharing this information with your clinician will allow him/her to most efficiently treat your condition. These exercises may help you when beginning to rehabilitate your injury. Your symptoms may resolve with or without further involvement from your physician, physical therapist or athletic trainer. While completing these exercises, remember:   Restoring tissue flexibility helps normal motion to return to the joints. This allows healthier, less painful movement and activity.  An effective stretch should be held for at least 30 seconds.  A stretch should never be painful. You should only feel a gentle lengthening or release in the stretched tissue. FLEXION RANGE OF MOTION AND STRETCHING EXERCISES: STRETCH - Flexion, Single Knee to Chest   Lie on a firm bed or floor  with both legs extended in front of you.  Keeping one leg in contact with the floor, bring your opposite knee to your chest. Hold your leg in place by either grabbing behind your thigh or at your knee.  Pull until you feel a gentle stretch in your low back. Hold __________ seconds.  Slowly release your grasp and repeat the exercise with the opposite side. Repeat __________ times. Complete this exercise __________ times per day.  STRETCH - Flexion, Double Knee to Chest  Lie on a firm bed or floor with both legs extended in front of you.  Keeping one leg in contact with the floor, bring your opposite knee to your chest.  Tense your stomach muscles to support your back and then lift your other knee to your chest. Hold your legs in place by either grabbing behind your thighs or at your knees.  Pull both knees toward your chest until you feel a gentle stretch in your low back. Hold __________ seconds.  Tense your stomach muscles and slowly return one leg at a time to the floor. Repeat __________ times. Complete this exercise __________ times per day.  STRETCH - Low Trunk Rotation   Lie on a firm bed or floor. Keeping your legs in front of you, bend your knees so they are both pointed toward the ceiling and your feet are flat on the floor.  Extend your arms out to the side. This will stabilize your upper body by keeping your shoulders in contact with the floor.  Gently and slowly drop both knees together to one side until   you feel a gentle stretch in your low back. Hold for __________ seconds.  Tense your stomach muscles to support your low back as you bring your knees back to the starting position. Repeat the exercise to the other side. Repeat __________ times. Complete this exercise __________ times per day  EXTENSION RANGE OF MOTION AND FLEXIBILITY EXERCISES: STRETCH - Extension, Prone on Elbows  Lie on your stomach on the floor, a bed will be too soft. Place your palms about shoulder  width apart and at the height of your head.  Place your elbows under your shoulders. If this is too painful, stack pillows under your chest.  Allow your body to relax so that your hips drop lower and make contact more completely with the floor.  Hold this position for __________ seconds.  Slowly return to lying flat on the floor. Repeat __________ times. Complete this exercise __________ times per day.  RANGE OF MOTION - Extension, Prone Press Ups  Lie on your stomach on the floor, a bed will be too soft. Place your palms about shoulder width apart and at the height of your head.  Keeping your back as relaxed as possible, slowly straighten your elbows while keeping your hips on the floor. You may adjust the placement of your hands to maximize your comfort. As you gain motion, your hands will come more underneath your shoulders.  Hold this position __________ seconds.  Slowly return to lying flat on the floor. Repeat __________ times. Complete this exercise __________ times per day.  STRENGTHENING EXERCISES - Sciatica  These exercises may help you when beginning to rehabilitate your injury. These exercises should be done near your "sweet spot." This is the neutral, low-back arch, somewhere between fully rounded and fully arched, that is your least painful position. When performed in this safe range of motion, these exercises can be used for people who have either a flexion or extension based injury. These exercises may resolve your symptoms with or without further involvement from your physician, physical therapist or athletic trainer. While completing these exercises, remember:   Muscles can gain both the endurance and the strength needed for everyday activities through controlled exercises.  Complete these exercises as instructed by your physician, physical therapist or athletic trainer. Progress with the resistance and repetition exercises only as your caregiver advises.  You may  experience muscle soreness or fatigue, but the pain or discomfort you are trying to eliminate should never worsen during these exercises. If this pain does worsen, stop and make certain you are following the directions exactly. If the pain is still present after adjustments, discontinue the exercise until you can discuss the trouble with your clinician. STRENGTHENING - Deep Abdominals, Pelvic Tilt   Lie on a firm bed or floor. Keeping your legs in front of you, bend your knees so they are both pointed toward the ceiling and your feet are flat on the floor.  Tense your lower abdominal muscles to press your low back into the floor. This motion will rotate your pelvis so that your tail bone is scooping upwards rather than pointing at your feet or into the floor.  With a gentle tension and even breathing, hold this position for __________ seconds. Repeat __________ times. Complete this exercise __________ times per day.  STRENGTHENING - Abdominals, Crunches   Lie on a firm bed or floor. Keeping your legs in front of you, bend your knees so they are both pointed toward the ceiling and your feet are flat on the   floor. Cross your arms over your chest.  Slightly tip your chin down without bending your neck.  Tense your abdominals and slowly lift your trunk high enough to just clear your shoulder blades. Lifting higher can put excessive stress on the low back and does not further strengthen your abdominal muscles.  Control your return to the starting position. Repeat __________ times. Complete this exercise __________ times per day.  STRENGTHENING - Quadruped, Opposite UE/LE Lift  Assume a hands and knees position on a firm surface. Keep your hands under your shoulders and your knees under your hips. You may place padding under your knees for comfort.  Find your neutral spine and gently tense your abdominal muscles so that you can maintain this position. Your shoulders and hips should form a rectangle  that is parallel with the floor and is not twisted.  Keeping your trunk steady, lift your right hand no higher than your shoulder and then your left leg no higher than your hip. Make sure you are not holding your breath. Hold this position __________ seconds.  Continuing to keep your abdominal muscles tense and your back steady, slowly return to your starting position. Repeat with the opposite arm and leg. Repeat __________ times. Complete this exercise __________ times per day.  STRENGTHENING - Abdominals and Quadriceps, Straight Leg Raise   Lie on a firm bed or floor with both legs extended in front of you.  Keeping one leg in contact with the floor, bend the other knee so that your foot can rest flat on the floor.  Find your neutral spine, and tense your abdominal muscles to maintain your spinal position throughout the exercise.  Slowly lift your straight leg off the floor about 6 inches for a count of 15, making sure to not hold your breath.  Still keeping your neutral spine, slowly lower your leg all the way to the floor. Repeat this exercise with each leg __________ times. Complete this exercise __________ times per day. POSTURE AND BODY MECHANICS CONSIDERATIONS - Sciatica Keeping correct posture when sitting, standing or completing your activities will reduce the stress put on different body tissues, allowing injured tissues a chance to heal and limiting painful experiences. The following are general guidelines for improved posture. Your physician or physical therapist will provide you with any instructions specific to your needs. While reading these guidelines, remember:  The exercises prescribed by your provider will help you have the flexibility and strength to maintain correct postures.  The correct posture provides the optimal environment for your joints to work. All of your joints have less wear and tear when properly supported by a spine with good posture. This means you will  experience a healthier, less painful body.  Correct posture must be practiced with all of your activities, especially prolonged sitting and standing. Correct posture is as important when doing repetitive low-stress activities (typing) as it is when doing a single heavy-load activity (lifting). RESTING POSITIONS Consider which positions are most painful for you when choosing a resting position. If you have pain with flexion-based activities (sitting, bending, stooping, squatting), choose a position that allows you to rest in a less flexed posture. You would want to avoid curling into a fetal position on your side. If your pain worsens with extension-based activities (prolonged standing, working overhead), avoid resting in an extended position such as sleeping on your stomach. Most people will find more comfort when they rest with their spine in a more neutral position, neither too rounded nor too   arched. Lying on a non-sagging bed on your side with a pillow between your knees, or on your back with a pillow under your knees will often provide some relief. Keep in mind, being in any one position for a prolonged period of time, no matter how correct your posture, can still lead to stiffness. PROPER SITTING POSTURE In order to minimize stress and discomfort on your spine, you must sit with correct posture Sitting with good posture should be effortless for a healthy body. Returning to good posture is a gradual process. Many people can work toward this most comfortably by using various supports until they have the flexibility and strength to maintain this posture on their own. When sitting with proper posture, your ears will fall over your shoulders and your shoulders will fall over your hips. You should use the back of the chair to support your upper back. Your low back will be in a neutral position, just slightly arched. You may place a small pillow or folded towel at the base of your low back for support.  When  working at a desk, create an environment that supports good, upright posture. Without extra support, muscles fatigue and lead to excessive strain on joints and other tissues. Keep these recommendations in mind: CHAIR:   A chair should be able to slide under your desk when your back makes contact with the back of the chair. This allows you to work closely.  The chair's height should allow your eyes to be level with the upper part of your monitor and your hands to be slightly lower than your elbows. BODY POSITION  Your feet should make contact with the floor. If this is not possible, use a foot rest.  Keep your ears over your shoulders. This will reduce stress on your neck and low back. INCORRECT SITTING POSTURES   If you are feeling tired and unable to assume a healthy sitting posture, do not slouch or slump. This puts excessive strain on your back tissues, causing more damage and pain. Healthier options include:  Using more support, like a lumbar pillow.  Switching tasks to something that requires you to be upright or walking.  Talking a brief walk.  Lying down to rest in a neutral-spine position. PROLONGED STANDING WHILE SLIGHTLY LEANING FORWARD  When completing a task that requires you to lean forward while standing in one place for a long time, place either foot up on a stationary 2-4 inch high object to help maintain the best posture. When both feet are on the ground, the low back tends to lose its slight inward curve. If this curve flattens (or becomes too large), then the back and your other joints will experience too much stress, fatigue more quickly and can cause pain.  CORRECT STANDING POSTURES Proper standing posture should be assumed with all daily activities, even if they only take a few moments, like when brushing your teeth. As in sitting, your ears should fall over your shoulders and your shoulders should fall over your hips. You should keep a slight tension in your abdominal  muscles to brace your spine. Your tailbone should point down to the ground, not behind your body, resulting in an over-extended swayback posture.  INCORRECT STANDING POSTURES  Common incorrect standing postures include a forward head, locked knees and/or an excessive swayback. WALKING Walk with an upright posture. Your ears, shoulders and hips should all line-up. PROLONGED ACTIVITY IN A FLEXED POSITION When completing a task that requires you to bend forward   at your waist or lean over a low surface, try to find a way to stabilize 3 of 4 of your limbs. You can place a hand or elbow on your thigh or rest a knee on the surface you are reaching across. This will provide you more stability so that your muscles do not fatigue as quickly. By keeping your knees relaxed, or slightly bent, you will also reduce stress across your low back. CORRECT LIFTING TECHNIQUES DO :   Assume a wide stance. This will provide you more stability and the opportunity to get as close as possible to the object which you are lifting.  Tense your abdominals to brace your spine; then bend at the knees and hips. Keeping your back locked in a neutral-spine position, lift using your leg muscles. Lift with your legs, keeping your back straight.  Test the weight of unknown objects before attempting to lift them.  Try to keep your elbows locked down at your sides in order get the best strength from your shoulders when carrying an object.  Always ask for help when lifting heavy or awkward objects. INCORRECT LIFTING TECHNIQUES DO NOT:   Lock your knees when lifting, even if it is a small object.  Bend and twist. Pivot at your feet or move your feet when needing to change directions.  Assume that you cannot safely pick up a paperclip without proper posture.   This information is not intended to replace advice given to you by your health care provider. Make sure you discuss any questions you have with your health care provider.     Document Released: 09/15/2005 Document Revised: 01/30/2015 Document Reviewed: 12/28/2008 Elsevier Interactive Patient Education 2016 [employer]. Constipation, Adult Constipation is when a person has fewer than three bowel movements a week, has difficulty having a bowel movement, or has stools that are dry, hard, or larger than normal. As people grow older, constipation is more common. A low-fiber diet, not taking in enough fluids, and taking certain medicines may make constipation worse.  CAUSES   Certain medicines, such as antidepressants, pain medicine, iron supplements, antacids, and water pills.   Certain diseases, such as diabetes, irritable bowel syndrome (IBS), thyroid disease, or depression.   Not drinking enough water.   Not eating enough fiber-rich foods.   Stress or travel.   Lack of physical activity or exercise.   Ignoring the urge to have a bowel movement.   Using laxatives too much.  SIGNS AND SYMPTOMS   Having fewer than three bowel movements a week.   Straining to have a bowel movement.   Having stools that are hard, dry, or larger than normal.   Feeling full or bloated.   Pain in the lower abdomen.   Not feeling relief after having a bowel movement.  DIAGNOSIS  Your health care provider will take a medical history and perform a physical exam. Further testing may be done for severe constipation. Some tests may include:  A barium enema X-ray to examine your rectum, colon, and, sometimes, your small intestine.   A sigmoidoscopy to examine your lower colon.   A colonoscopy to examine your entire colon. TREATMENT  Treatment will depend on the severity of your constipation and what is causing it. Some dietary treatments include drinking more fluids and eating more fiber-rich foods. Lifestyle treatments may include regular exercise. If these diet and lifestyle recommendations do not help, your health care provider may recommend taking  over-the-counter laxative medicines to help you have bowel movements.  Prescription medicines may be prescribed if over-the-counter medicines do not work.  HOME CARE INSTRUCTIONS   Eat foods that have a lot of fiber, such as fruits, vegetables, whole grains, and beans.  Limit foods high in fat and processed sugars, such as french fries, hamburgers, cookies, candies, and soda.   A fiber supplement may be added to your diet if you cannot get enough fiber from foods.   Drink enough fluids to keep your urine clear or pale yellow.   Exercise regularly or as directed by your health care provider.   Go to the restroom when you have the urge to go. Do not hold it.   Only take over-the-counter or prescription medicines as directed by your health care provider. Do not take other medicines for constipation without talking to your health care provider first.  SEEK IMMEDIATE MEDICAL CARE IF:   You have bright red blood in your stool.   Your constipation lasts for more than 4 days or gets worse.   You have abdominal or rectal pain.   You have thin, pencil-like stools.   You have unexplained weight loss. MAKE SURE YOU:   Understand these instructions.  Will watch your condition.  Will get help right away if you are not doing well or get worse.   This information is not intended to replace advice given to you by your health care provider. Make sure you discuss any questions you have with your health care provider.   Document Released: 06/13/2004 Document Revised: 10/06/2014 Document Reviewed: 06/27/2013 Elsevier Interactive Patient Education [employer]

## 2015-12-11 ENCOUNTER — Encounter: Payer: Medicaid Other | Admitting: Family Medicine

## 2016-01-01 ENCOUNTER — Ambulatory Visit (HOSPITAL_COMMUNITY)
Admission: RE | Admit: 2016-01-01 | Discharge: 2016-01-01 | Disposition: A | Discharge status: Home / Self Care | Payer: Medicaid Other | Source: Ambulatory Visit | Attending: Obstetrics & Gynecology | Admitting: Obstetrics & Gynecology

## 2016-01-01 ENCOUNTER — Encounter: Payer: Self-pay | Admitting: Obstetrics and Gynecology

## 2016-01-01 ENCOUNTER — Other Ambulatory Visit: Payer: Self-pay | Admitting: Obstetrics & Gynecology

## 2016-01-01 ENCOUNTER — Ambulatory Visit (INDEPENDENT_AMBULATORY_CARE_PROVIDER_SITE_OTHER): Payer: Medicaid Other | Admitting: Obstetrics and Gynecology

## 2016-01-01 ENCOUNTER — Encounter (HOSPITAL_COMMUNITY): Payer: Self-pay

## 2016-01-01 VITALS — BP 119/75 | HR 100 | Wt 181.0 lb

## 2016-01-01 VITALS — BP 116/74 | HR 102 | Wt 179.0 lb

## 2016-01-01 DIAGNOSIS — O09292 Supervision of pregnancy with other poor reproductive or obstetric history, second trimester: Secondary | ICD-10-CM

## 2016-01-01 DIAGNOSIS — Z3A24 24 weeks gestation of pregnancy: Secondary | ICD-10-CM | POA: Diagnosis not present

## 2016-01-01 DIAGNOSIS — B009 Herpesviral infection, unspecified: Secondary | ICD-10-CM

## 2016-01-01 DIAGNOSIS — Z0489 Encounter for examination and observation for other specified reasons: Secondary | ICD-10-CM

## 2016-01-01 DIAGNOSIS — A6000 Herpesviral infection of urogenital system, unspecified: Secondary | ICD-10-CM | POA: Insufficient documentation

## 2016-01-01 DIAGNOSIS — O98312 Other infections with a predominantly sexual mode of transmission complicating pregnancy, second trimester: Secondary | ICD-10-CM | POA: Insufficient documentation

## 2016-01-01 DIAGNOSIS — O98512 Other viral diseases complicating pregnancy, second trimester: Secondary | ICD-10-CM

## 2016-01-01 DIAGNOSIS — Z3482 Encounter for supervision of other normal pregnancy, second trimester: Secondary | ICD-10-CM

## 2016-01-01 DIAGNOSIS — IMO0002 Reserved for concepts with insufficient information to code with codable children: Secondary | ICD-10-CM

## 2016-01-01 DIAGNOSIS — Z36 Encounter for antenatal screening of mother: Secondary | ICD-10-CM | POA: Insufficient documentation

## 2016-01-01 NOTE — Progress Notes (Signed)
Subjective:  Shannon Jarvis is a 30 y.o. G2P1001 at 5679w2d being seen today for ongoing prenatal care.  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  Patient reports no complaints.  Contractions: Not present. Vag. Bleeding: None.  Movement: Present. Denies leaking of fluid.   The following portions of the patient's history were reviewed and updated as appropriate: allergies, current medications, past family history, past medical history, past social history, past surgical history and problem list. Problem list updated.  Objective:   Filed Vitals:   01/01/16 1306  BP: 116/74  Pulse: 102  Weight: 179 lb (81.194 kg)    Fetal Status: Fetal Heart Rate (bpm): 140   Movement: Present     General:  Alert, oriented and cooperative. Patient is in no acute distress.  Skin: Skin is warm and dry. No rash noted.   Cardiovascular: Normal heart rate noted  Respiratory: Normal respiratory effort, no problems with respiration noted  Abdomen: Soft, gravid, appropriate for gestational age. Pain/Pressure: Absent     Pelvic: Vag. Bleeding: None Vag D/C Character: Thin   Cervical exam deferred        Extremities: Normal range of motion.  Edema: None  Mental Status: Normal mood and affect. Normal behavior. Normal judgment and thought content.   Urinalysis: Urine Protein: Trace Urine Glucose: Negative  Assessment and Plan:  Pregnancy: G2P1001 at 7179w2d  1. Supervision of normal pregnancy, antepartum, second trimester Patient is doing well without complaints Follow-up anatomy ultrasound today  2. 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 History of pre-eclampsia in prior pregnancy, currently pregnant, second trimester Continue ASA daily  Preterm labor symptoms and general obstetric precautions  including but not limited to vaginal bleeding, contractions, leaking of fluid and fetal movement were reviewed in detail with the patient. Please refer to After Visit Summary for other counseling recommendations.  Return in about 4 weeks (around 01/29/2016).   Catalina AntiguaPeggy Marquee Fuchs, MD

## 2016-01-07 NOTE — Addendum Note (Signed)
Encounter addended by: Willodean Rosenthalarolyn Harraway-Smith, MD on: 01/07/2016  1:58 PM<BR>     Documentation filed: Problem List

## 2016-01-09 ENCOUNTER — Encounter: Payer: Self-pay | Admitting: *Deleted

## 2016-01-09 ENCOUNTER — Telehealth: Payer: Self-pay | Admitting: *Deleted

## 2016-01-09 DIAGNOSIS — Z3482 Encounter for supervision of other normal pregnancy, second trimester: Secondary | ICD-10-CM

## 2016-01-09 NOTE — Telephone Encounter (Signed)
[facility]

## 2016-01-29 ENCOUNTER — Encounter: Payer: Medicaid Other | Admitting: Obstetrics & Gynecology

## 2016-02-05 ENCOUNTER — Encounter: Payer: Self-pay | Admitting: *Deleted

## 2016-02-05 ENCOUNTER — Ambulatory Visit (INDEPENDENT_AMBULATORY_CARE_PROVIDER_SITE_OTHER): Payer: Medicaid Other | Admitting: Family Medicine

## 2016-02-05 VITALS — BP 116/78 | HR 99 | Wt 187.0 lb

## 2016-02-05 DIAGNOSIS — Z36 Encounter for antenatal screening of mother: Secondary | ICD-10-CM | POA: Diagnosis not present

## 2016-02-05 DIAGNOSIS — O09293 Supervision of pregnancy with other poor reproductive or obstetric history, third trimester: Secondary | ICD-10-CM

## 2016-02-05 DIAGNOSIS — Z3482 Encounter for supervision of other normal pregnancy, second trimester: Secondary | ICD-10-CM

## 2016-02-05 NOTE — Progress Notes (Signed)
Subjective:  Shannon Jarvis is a 30 y.o. G2P1001 at 2249w2d being seen today for ongoing prenatal care.  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  Patient reports anxiety.  Contractions: Irregular. Vag. Bleeding: None.  Movement: Present. Denies leaking of fluid.   The following portions of the patient's history were reviewed and updated as appropriate: allergies, current medications, past family history, past medical history, past social history, past surgical history and problem list. Problem list updated.  Objective:   Filed Vitals:   02/05/16 1055  BP: 116/78  Pulse: 99  Weight: 187 lb (84.823 kg)    Fetal Status: Fetal Heart Rate (bpm): 143 Fundal Height: 29 cm Movement: Present     General:  Alert, oriented and cooperative. Patient is in no acute distress.  Skin: Skin is warm and dry. No rash noted.   Cardiovascular: Normal heart rate noted  Respiratory: Normal respiratory effort, no problems with respiration noted  Abdomen: Soft, gravid, appropriate for gestational age. Pain/Pressure: Absent     Pelvic: Vag. Bleeding: None Vag D/C Character: Thin   Cervical exam deferred        Extremities: Normal range of motion.  Edema: None  Mental Status: Normal mood and affect. Normal behavior. Normal judgment and thought content.    Assessment and Plan:  Pregnancy: G2P1001 at 6749w2d  1. Supervision of normal pregnancy, antepartum, second trimester - Glucose Tolerance, 1 HR (50g) - CBC - RPR - HIV antibody  2. History of pre-eclampsia in prior pregnancy, currently pregnant, third trimester Continue Baby ASA  Preterm labor symptoms and general obstetric precautions including but not limited to vaginal bleeding, contractions, leaking of fluid and fetal movement  were reviewed in detail with the patient. Please refer to After Visit Summary for other counseling recommendations.  Return in about 2 weeks (around 02/19/2016) for ob visit.   Reva Boresanya S Binyamin Nelis, MD

## 2016-02-05 NOTE — Patient Instructions (Addendum)
Mindfulness training Third Trimester of Pregnancy The third trimester is from week 29 through week 42, months 7 through 9. The third trimester is a time when the fetus is growing rapidly. At the end of the ninth month, the fetus is about 20 inches in length and weighs 6-10 pounds.  BODY CHANGES Your body goes through many changes during pregnancy. The changes vary from woman to woman.   Your weight will continue to increase. You can expect to gain 25-35 pounds (11-16 kg) by the end of the pregnancy.  You may begin to get stretch marks on your hips, abdomen, and breasts.  You may urinate more often because the fetus is moving lower into your pelvis and pressing on your bladder.  You may develop or continue to have heartburn as a result of your pregnancy.  You may develop constipation because certain hormones are causing the muscles that push waste through your intestines to slow down.  You may develop hemorrhoids or swollen, bulging veins (varicose veins).  You may have pelvic pain because of the weight gain and pregnancy hormones relaxing your joints between the bones in your pelvis. Backaches may result from overexertion of the muscles supporting your posture.  You may have changes in your hair. These can include thickening of your hair, rapid growth, and changes in texture. Some women also have hair loss during or after pregnancy, or hair that feels dry or thin. Your hair will most likely return to normal after your baby is born.  Your breasts will continue to grow and be tender. A yellow discharge may leak from your breasts called colostrum.  Your belly button may stick out.  You may feel short of breath because of your expanding uterus.  You may notice the fetus "dropping," or moving lower in your abdomen.  You may have a bloody mucus discharge. This usually occurs a few days to a week before labor begins.  Your cervix becomes thin and soft (effaced) near your due date. WHAT TO  EXPECT AT YOUR PRENATAL EXAMS  You will have prenatal exams every 2 weeks until week 36. Then, you will have weekly prenatal exams. During a routine prenatal visit:  You will be weighed to make sure you and the fetus are growing normally.  Your blood pressure is taken.  Your abdomen will be measured to track your baby's growth.  The fetal heartbeat will be listened to.  Any test results from the previous visit will be discussed.  You may have a cervical check near your due date to see if you have effaced. At around 36 weeks, your caregiver will check your cervix. At the same time, your caregiver will also perform a test on the secretions of the vaginal tissue. This test is to determine if a type of bacteria, Group B streptococcus, is present. Your caregiver will explain this further. Your caregiver may ask you:  What your birth plan is.  How you are feeling.  If you are feeling the baby move.  If you have had any abnormal symptoms, such as leaking fluid, bleeding, severe headaches, or abdominal cramping.  If you are using any tobacco products, including cigarettes, chewing tobacco, and electronic cigarettes.  If you have any questions. Other tests or screenings that may be performed during your third trimester include:  Blood tests that check for low iron levels (anemia).  Fetal testing to check the health, activity level, and growth of the fetus. Testing is done if you have certain medical conditions  or if there are problems during the pregnancy.  HIV (human immunodeficiency virus) testing. If you are at high risk, you may be screened for HIV during your third trimester of pregnancy. FALSE LABOR You may feel small, irregular contractions that eventually go away. These are called Braxton Hicks contractions, or false labor. Contractions may last for hours, days, or even weeks before true labor sets in. If contractions come at regular intervals, intensify, or become painful, it is  best to be seen by your caregiver.  SIGNS OF LABOR   Menstrual-like cramps.  Contractions that are 5 minutes apart or less.  Contractions that start on the top of the uterus and spread down to the lower abdomen and back.  A sense of increased pelvic pressure or back pain.  A watery or bloody mucus discharge that comes from the vagina. If you have any of these signs before the 37th week of pregnancy, call your caregiver right away. You need to go to the hospital to get checked immediately. HOME CARE INSTRUCTIONS   Avoid all smoking, herbs, alcohol, and unprescribed drugs. These chemicals affect the formation and growth of the baby.  Do not use any tobacco products, including cigarettes, chewing tobacco, and electronic cigarettes. If you need help quitting, ask your health care provider. You may receive counseling support and other resources to help you quit.  Follow your caregiver's instructions regarding medicine use. There are medicines that are either safe or unsafe to take during pregnancy.  Exercise only as directed by your caregiver. Experiencing uterine cramps is a good sign to stop exercising.  Continue to eat regular, healthy meals.  Wear a good support bra for breast tenderness.  Do not use hot tubs, steam rooms, or saunas.  Wear your seat belt at all times when driving.  Avoid raw meat, uncooked cheese, cat litter boxes, and soil used by cats. These carry germs that can cause birth defects in the baby.  Take your prenatal vitamins.  Take 1500-2000 mg of calcium daily starting at the 20th week of pregnancy until you deliver your baby.  Try taking a stool softener (if your caregiver approves) if you develop constipation. Eat more high-fiber foods, such as fresh vegetables or fruit and whole grains. Drink plenty of fluids to keep your urine clear or pale yellow.  Take warm sitz baths to soothe any pain or discomfort caused by hemorrhoids. Use hemorrhoid cream if your  caregiver approves.  If you develop varicose veins, wear support hose. Elevate your feet for 15 minutes, 3-4 times a day. Limit salt in your diet.  Avoid heavy lifting, wear low heal shoes, and practice good posture.  Rest a lot with your legs elevated if you have leg cramps or low back pain.  Visit your dentist if you have not gone during your pregnancy. Use a soft toothbrush to brush your teeth and be gentle when you floss.  A sexual relationship may be continued unless your caregiver directs you otherwise.  Do not travel far distances unless it is absolutely necessary and only with the approval of your caregiver.  Take prenatal classes to understand, practice, and ask questions about the labor and delivery.  Make a trial run to the hospital.  Pack your hospital bag.  Prepare the baby's nursery.  Continue to go to all your prenatal visits as directed by your caregiver. SEEK MEDICAL CARE IF:  You are unsure if you are in labor or if your water has broken.  You have dizziness.  You have mild pelvic cramps, pelvic pressure, or nagging pain in your abdominal area.  You have persistent nausea, vomiting, or diarrhea.  You have a bad smelling vaginal discharge.  You have pain with urination. SEEK IMMEDIATE MEDICAL CARE IF:   You have a fever.  You are leaking fluid from your vagina.  You have spotting or bleeding from your vagina.  You have severe abdominal cramping or pain.  You have rapid weight loss or gain.  You have shortness of breath with chest pain.  You notice sudden or extreme swelling of your face, hands, ankles, feet, or legs.  You have not felt your baby move in over an hour.  You have severe headaches that do not go away with medicine.  You have vision changes.   This information is not intended to replace advice given to you by your health care provider. Make sure you discuss any questions you have with your health care provider.   Document  Released: 09/09/2001 Document Revised: 10/06/2014 Document Reviewed: 11/16/2012 Elsevier Interactive Patient Education [employer]  Breastfeeding Deciding to breastfeed is one of the best choices you can make for you and your baby. A change in hormones during pregnancy causes your breast tissue to grow and increases the number and size of your milk ducts. These hormones also allow proteins, sugars, and fats from your blood supply to make breast milk in your milk-producing glands. Hormones prevent breast milk from being released before your baby is born as well as prompt milk flow after birth. Once breastfeeding has begun, thoughts of your baby, as well as his or her sucking or crying, can stimulate the release of milk from your milk-producing glands.  BENEFITS OF BREASTFEEDING For Your Baby  Your first milk (colostrum) helps your baby's digestive system function better.  There are antibodies in your milk that help your baby fight off infections.  Your baby has a lower incidence of asthma, allergies, and sudden infant death syndrome.  The nutrients in breast milk are better for your baby than infant formulas and are designed uniquely for your baby's needs.  Breast milk improves your baby's brain development.  Your baby is less likely to develop other conditions, such as childhood obesity, asthma, or type 2 diabetes mellitus. For You  Breastfeeding helps to create a very special bond between you and your baby.  Breastfeeding is convenient. Breast milk is always available at the correct temperature and costs nothing.  Breastfeeding helps to burn calories and helps you lose the weight gained during pregnancy.  Breastfeeding makes your uterus contract to its prepregnancy size faster and slows bleeding (lochia) after you give birth.   Breastfeeding helps to lower your risk of developing type 2 diabetes mellitus, osteoporosis, and breast or ovarian cancer later in life. SIGNS THAT  YOUR BABY IS HUNGRY Early Signs of Hunger  Increased alertness or activity.  Stretching.  Movement of the head from side to side.  Movement of the head and opening of the mouth when the corner of the mouth or cheek is stroked (rooting).  Increased sucking sounds, smacking lips, cooing, sighing, or squeaking.  Hand-to-mouth movements.  Increased sucking of fingers or hands. Late Signs of Hunger  Fussing.  Intermittent crying. Extreme Signs of Hunger Signs of extreme hunger will require calming and consoling before your baby will be able to breastfeed successfully. Do not wait for the following signs of extreme hunger to occur before you initiate breastfeeding:  Restlessness.  A loud, strong cry.  Screaming. BREASTFEEDING BASICS Breastfeeding Initiation  Find a comfortable place to sit or lie down, with your neck and back well supported.  Place a pillow or rolled up blanket under your baby to bring him or her to the level of your breast (if you are seated). Nursing pillows are specially designed to help support your arms and your baby while you breastfeed.  Make sure that your baby's abdomen is facing your abdomen.  Gently massage your breast. With your fingertips, massage from your chest wall toward your nipple in a circular motion. This encourages milk flow. You may need to continue this action during the feeding if your milk flows slowly.  Support your breast with 4 fingers underneath and your thumb above your nipple. Make sure your fingers are well away from your nipple and your baby's mouth.  Stroke your baby's lips gently with your finger or nipple.  When your baby's mouth is open wide enough, quickly bring your baby to your breast, placing your entire nipple and as much of the colored area around your nipple (areola) as possible into your baby's mouth.  More areola should be visible above your baby's upper lip than below the lower lip.  Your baby's tongue should  be between his or her lower gum and your breast.  Ensure that your baby's mouth is correctly positioned around your nipple (latched). Your baby's lips should create a seal on your breast and be turned out (everted).  It is common for your baby to suck about 2-3 minutes in order to start the flow of breast milk. Latching Teaching your baby how to latch on to your breast properly is very important. An improper latch can cause nipple pain and decreased milk supply for you and poor weight gain in your baby. Also, if your baby is not latched onto your nipple properly, he or she may swallow some air during feeding. This can make your baby fussy. Burping your baby when you switch breasts during the feeding can help to get rid of the air. However, teaching your baby to latch on properly is still the best way to prevent fussiness from swallowing air while breastfeeding. Signs that your baby has successfully latched on to your nipple:  Silent tugging or silent sucking, without causing you pain.  Swallowing heard between every 3-4 sucks.  Muscle movement above and in front of his or her ears while sucking. Signs that your baby has not successfully latched on to nipple:  Sucking sounds or smacking sounds from your baby while breastfeeding.  Nipple pain. If you think your baby has not latched on correctly, slip your finger into the corner of your baby's mouth to break the suction and place it between your baby's gums. Attempt breastfeeding initiation again. Signs of Successful Breastfeeding Signs from your baby:  A gradual decrease in the number of sucks or complete cessation of sucking.  Falling asleep.  Relaxation of his or her body.  Retention of a small amount of milk in his or her mouth.  Letting go of your breast by himself or herself. Signs from you:  Breasts that have increased in firmness, weight, and size 1-3 hours after feeding.  Breasts that are softer immediately after  breastfeeding.  Increased milk volume, as well as a change in milk consistency and color by the fifth day of breastfeeding.  Nipples that are not sore, cracked, or bleeding. Signs That Your Pecola Leisure is Getting Enough Milk  Wetting at least 3 diapers in a 24-hour  period. The urine should be clear and pale yellow by age 12 days.  At least 3 stools in a 24-hour period by age 12 days. The stool should be soft and yellow.  At least 3 stools in a 24-hour period by age 16 days. The stool should be seedy and yellow.  No loss of weight greater than 10% of birth weight during the first 423 days of age.  Average weight gain of 4-7 ounces (113-198 g) per week after age 336 days.  Consistent daily weight gain by age 12 days, without weight loss after the age of 2 weeks. After a feeding, your baby may spit up a small amount. This is common. BREASTFEEDING FREQUENCY AND DURATION Frequent feeding will help you make more milk and can prevent sore nipples and breast engorgement. Breastfeed when you feel the need to reduce the fullness of your breasts or when your baby shows signs of hunger. This is called "breastfeeding on demand." Avoid introducing a pacifier to your baby while you are working to establish breastfeeding (the first 4-6 weeks after your baby is born). After this time you may choose to use a pacifier. Research has shown that pacifier use during the first year of a baby's life decreases the risk of sudden infant death syndrome (SIDS). Allow your baby to feed on each breast as long as he or she wants. Breastfeed until your baby is finished feeding. When your baby unlatches or falls asleep while feeding from the first breast, offer the second breast. Because newborns are often sleepy in the first few weeks of life, you may need to awaken your baby to get him or her to feed. Breastfeeding times will vary from baby to baby. However, the following rules can serve as a guide to help you ensure that your baby is  properly fed:  Newborns (babies 154 weeks of age or younger) may breastfeed every 1-3 hours.  Newborns should not go longer than 3 hours during the day or 5 hours during the night without breastfeeding.  You should breastfeed your baby a minimum of 8 times in a 24-hour period until you begin to introduce solid foods to your baby at around 466 months of age. BREAST MILK PUMPING Pumping and storing breast milk allows you to ensure that your baby is exclusively fed your breast milk, even at times when you are unable to breastfeed. This is especially important if you are going back to work while you are still breastfeeding or when you are not able to be present during feedings. Your lactation consultant can give you guidelines on how long it is safe to store breast milk. A breast pump is a machine that allows you to pump milk from your breast into a sterile bottle. The pumped breast milk can then be stored in a refrigerator or freezer. Some breast pumps are operated by hand, while others use electricity. Ask your lactation consultant which type will work best for you. Breast pumps can be purchased, but some hospitals and breastfeeding support groups lease breast pumps on a monthly basis. A lactation consultant can teach you how to hand express breast milk, if you prefer not to use a pump. CARING FOR YOUR BREASTS WHILE YOU BREASTFEED Nipples can become dry, cracked, and sore while breastfeeding. The following recommendations can help keep your breasts moisturized and healthy:  Avoid using soap on your nipples.  Wear a supportive bra. Although not required, special nursing bras and tank tops are designed to allow access to  breasts for breastfeeding without taking off your entire bra or top. Avoid wearing underwire-style bras or extremely tight bras.  Air dry your nipples for 3-4minutes after each feeding.  Use only cotton bra pads to absorb leaked breast milk. Leaking of breast milk between feedings  is normal.  Use lanolin on your nipples after breastfeeding. Lanolin helps to maintain your skin's normal moisture barrier. If you use pure lanolin, you do not need to wash it off before feeding your baby again. Pure lanolin is not toxic to your baby. You may also hand express a few drops of breast milk and gently massage that milk into your nipples and allow the milk to air dry. In the first few weeks after giving birth, some women experience extremely full breasts (engorgement). Engorgement can make your breasts feel heavy, warm, and tender to the touch. Engorgement peaks within 3-5 days after you give birth. The following recommendations can help ease engorgement:  Completely empty your breasts while breastfeeding or pumping. You may want to start by applying warm, moist heat (in the shower or with warm water-soaked hand towels) just before feeding or pumping. This increases circulation and helps the milk flow. If your baby does not completely empty your breasts while breastfeeding, pump any extra milk after he or she is finished.  Wear a snug bra (nursing or regular) or tank top for 1-2 days to signal your body to slightly decrease milk production.  Apply ice packs to your breasts, unless this is too uncomfortable for you.  Make sure that your baby is latched on and positioned properly while breastfeeding. If engorgement persists after 48 hours of following these recommendations, contact your health care provider or a lactation consultant. OVERALL HEALTH CARE RECOMMENDATIONS WHILE BREASTFEEDING  Eat healthy foods. Alternate between meals and snacks, eating 3 of each per day. Because what you eat affects your breast milk, some of the foods may make your baby more irritable than usual. Avoid eating these foods if you are sure that they are negatively affecting your baby.  Drink milk, fruit juice, and water to satisfy your thirst (about 10 glasses a day).  Rest often, relax, and continue to take  your prenatal vitamins to prevent fatigue, stress, and anemia.  Continue breast self-awareness checks.  Avoid chewing and smoking tobacco. Chemicals from cigarettes that pass into breast milk and exposure to secondhand smoke may harm your baby.  Avoid alcohol and drug use, including marijuana. Some medicines that may be harmful to your baby can pass through breast milk. It is important to ask your health care provider before taking any medicine, including all over-the-counter and prescription medicine as well as vitamin and herbal supplements. It is possible to become pregnant while breastfeeding. If birth control is desired, ask your health care provider about options that will be safe for your baby. SEEK MEDICAL CARE IF:  You feel like you want to stop breastfeeding or have become frustrated with breastfeeding.  You have painful breasts or nipples.  Your nipples are cracked or bleeding.  Your breasts are red, tender, or warm.  You have a swollen area on either breast.  You have a fever or chills.  You have nausea or vomiting.  You have drainage other than breast milk from your nipples.  Your breasts do not become full before feedings by the fifth day after you give birth.  You feel sad and depressed.  Your baby is too sleepy to eat well.  Your baby is having trouble sleeping.     sleeping.   Your baby is wetting less than 3 diapers in a 24-hour period.  Your baby has less than 3 stools in a 24-hour period.  Your baby's skin or the white part of his or her eyes becomes yellow.   Your baby is not gaining weight by 72 days of age. SEEK IMMEDIATE MEDICAL CARE IF:  Your baby is overly tired (lethargic) and does not want to wake up and feed.  Your baby develops an unexplained fever.   This information is not intended to replace advice given to you by your health care provider. Make sure you discuss any questions you have with your health care provider.   Document Released: 09/15/2005  Document Revised: 06/06/2015 Document Reviewed: 03/09/2013 Elsevier Interactive Patient Education [employer]

## 2016-02-05 NOTE — Progress Notes (Signed)
28wk labs today - pt requested to wait on TDAP until next visit

## 2016-02-06 ENCOUNTER — Telehealth: Payer: Self-pay | Admitting: *Deleted

## 2016-02-06 LAB — HIV ANTIBODY (ROUTINE TESTING W REFLEX): HIV 1&2 Ab, 4th Generation: NONREACTIVE

## 2016-02-06 LAB — CBC
HCT: 35.5 % (ref 35.0–45.0)
Hemoglobin: 11.9 g/dL (ref 11.7–15.5)
MCH: 33.6 pg — AB (ref 27.0–33.0)
MCHC: 33.5 g/dL (ref 32.0–36.0)
MCV: 100.3 fL — AB (ref 80.0–100.0)
MPV: 12.2 fL (ref 7.5–12.5)
PLATELETS: 135 10*3/uL — AB (ref 140–400)
RBC: 3.54 MIL/uL — ABNORMAL LOW (ref 3.80–5.10)
RDW: 12.6 % (ref 11.0–15.0)
WBC: 9.4 10*3/uL (ref 3.8–10.8)

## 2016-02-06 LAB — GLUCOSE TOLERANCE, 1 HOUR (50G) W/O FASTING: GLUCOSE, 1 HR, GESTATIONAL: 135 mg/dL (ref <140)

## 2016-02-06 LAB — RPR

## 2016-02-06 NOTE — Telephone Encounter (Signed)
-----   Message from Reva Boresanya S Pratt, MD sent at 02/06/2016  3:31 PM EDT ----- Abnl 1 hour--needs 3 hour

## 2016-02-06 NOTE — Telephone Encounter (Signed)
Called pt to adv needs 3h GTT - Unable to leave a message.

## 2016-02-08 ENCOUNTER — Inpatient Hospital Stay (HOSPITAL_COMMUNITY)
Admission: AD | Admit: 2016-02-08 | Discharge: 2016-02-09 | Disposition: A | Discharge status: Home / Self Care | Payer: Medicaid Other | Source: Ambulatory Visit | Attending: Obstetrics & Gynecology | Admitting: Obstetrics & Gynecology

## 2016-02-08 ENCOUNTER — Encounter (HOSPITAL_COMMUNITY): Payer: Self-pay

## 2016-02-08 DIAGNOSIS — Z3482 Encounter for supervision of other normal pregnancy, second trimester: Secondary | ICD-10-CM

## 2016-02-08 DIAGNOSIS — R109 Unspecified abdominal pain: Secondary | ICD-10-CM | POA: Diagnosis not present

## 2016-02-08 DIAGNOSIS — R197 Diarrhea, unspecified: Secondary | ICD-10-CM | POA: Insufficient documentation

## 2016-02-08 DIAGNOSIS — O26893 Other specified pregnancy related conditions, third trimester: Secondary | ICD-10-CM | POA: Diagnosis present

## 2016-02-08 DIAGNOSIS — A084 Viral intestinal infection, unspecified: Secondary | ICD-10-CM | POA: Diagnosis not present

## 2016-02-08 DIAGNOSIS — O09293 Supervision of pregnancy with other poor reproductive or obstetric history, third trimester: Secondary | ICD-10-CM

## 2016-02-08 DIAGNOSIS — O99613 Diseases of the digestive system complicating pregnancy, third trimester: Secondary | ICD-10-CM | POA: Diagnosis not present

## 2016-02-08 DIAGNOSIS — Z3A29 29 weeks gestation of pregnancy: Secondary | ICD-10-CM | POA: Diagnosis not present

## 2016-02-08 LAB — URINALYSIS, ROUTINE W REFLEX MICROSCOPIC
Bilirubin Urine: NEGATIVE
GLUCOSE, UA: NEGATIVE mg/dL
Hgb urine dipstick: NEGATIVE
Ketones, ur: NEGATIVE mg/dL
LEUKOCYTES UA: NEGATIVE
Nitrite: NEGATIVE
PROTEIN: NEGATIVE mg/dL
Specific Gravity, Urine: >1.03 — ABNORMAL HIGH (ref 1.005–1.030)
pH: 5.5 (ref 5.0–8.0)

## 2016-02-08 NOTE — MAU Note (Addendum)
Early this am started having bad abd cramps. Diarrhea 3 times earlier today - last time 0600 but cramping has continued all day. Denies LOF or bleeding. Some nausea but has indigestion with pregnancy but no emesis today. Baby esp active "like he is shaking" tonight

## 2016-02-09 DIAGNOSIS — A084 Viral intestinal infection, unspecified: Secondary | ICD-10-CM

## 2016-02-09 DIAGNOSIS — O99613 Diseases of the digestive system complicating pregnancy, third trimester: Secondary | ICD-10-CM

## 2016-02-09 NOTE — MAU Provider Note (Signed)
History     CSN: [id number]  Arrival date and time: 02/08/16 2250   First Provider Initiated Contact with Patient 02/09/16 0013      Chief Complaint  Patient presents with  . Abdominal Cramping  . Diarrhea   HPI Patient is 30 y.o. G2P1001 [redacted]w[redacted]d here with complaints of diarrhea and nausea with mild abdominal pain in the upper quadrants.  Reports 3 episodes of diarrhea this AM. Reports that she has had cramping which worried her and this is why she came to MAU. She reports chills but no fevers. No sick contracts. Denies vomiting but does have waves of nausea.   +FM, denies LOF, VB, contractions, vaginal discharge.   OB History    Gravida Para Term Preterm AB TAB SAB Ectopic Multiple Living   Past Medical History  Diagnosis Date  . Genital herpes   . Pregnancy induced hypertension     Past Surgical History  Procedure Laterality Date  . Tonsillectomy    . Wisdom tooth extraction  07/10/2015    Family History  Problem Relation Age of Onset  . Lupus Maternal Aunt   . Rheum arthritis Mother   . Hypertension Father   . Cancer Paternal Grandmother     Breast  . Lupus Cousin     Social History  Substance Use Topics  . Smoking status: Former Smoker -- 0.00 packs/day    Types: Cigarettes    Quit date: 10/03/2015  . Smokeless tobacco: Never Used  . Alcohol Use: No    Allergies:  Allergies  Allergen Reactions  . Augmentin [Amoxicillin-Pot Clavulanate] Hives and Other (See Comments)    Joint swelling    Prescriptions prior to admission  Medication Sig Dispense Refill Last Dose  . acetaminophen (TYLENOL) 325 MG tablet Take 325 mg by mouth every 6 (six) hours as needed for headache. Reported on 10/09/2015   Taking  . aspirin EC 81 MG tablet Take 1 tablet (81 mg total) by mouth daily. 60 tablet 2 Taking  . calcium carbonate (TUMS - DOSED IN MG ELEMENTAL CALCIUM) 500 MG chewable tablet Chew 2 tablets by mouth 3 (three) times daily as needed for  heartburn.    Taking  . Prenatal Vit-Fe Fumarate-FA (PRENATAL VITAMIN PO) Take by mouth.   Taking  . ranitidine (ZANTAC) 150 MG capsule Take 150 mg by mouth every evening.   Taking  . valACYclovir (VALTREX) 500 MG tablet Take 500 mg by mouth at bedtime. Reported on 01/01/2016   Taking    Review of Systems  Constitutional: Negative for fever and chills.  Eyes: Negative for blurred vision and double vision.  Respiratory: Negative for cough and shortness of breath.   Cardiovascular: Negative for chest pain and orthopnea.  Gastrointestinal: Negative for nausea and vomiting.  Genitourinary: Negative for dysuria, frequency and flank pain.  Musculoskeletal: Negative for myalgias.  Skin: Negative for rash.  Neurological: Negative for dizziness, tingling, weakness and headaches.  Endo/Heme/Allergies: Does not bruise/bleed easily.  Psychiatric/Behavioral: Negative for depression and suicidal ideas. The patient is not nervous/anxious.    Physical Exam   Blood pressure 129/75, pulse 93, temperature 98.5 F (36.9 C), resp. rate 18, height  (1.651 m), weight 189 lb 3.2 oz (85.821 kg), last menstrual period 07/15/2015, unknown if currently breastfeeding.  Physical Exam  Nursing note and vitals reviewed. Constitutional: She is oriented to person, place, and time. She appears well-developed and well-nourished. No distress.  Pregnant female  HENT:  Head: Normocephalic and atraumatic.  Eyes: Conjunctivae are normal. No scleral icterus.  Neck: Normal range of motion. Neck supple.  Cardiovascular: Normal rate and intact distal pulses.   Respiratory: Effort normal. She exhibits no tenderness.  GI: Soft. There is no tenderness. There is no rebound and no guarding.  Gravid  Genitourinary: Vagina normal.  Musculoskeletal: Normal range of motion. She exhibits no edema.  Neurological: She is alert and oriented to person, place, and time.  Skin: Skin is warm and dry. No rash noted.  Psychiatric: She  has a normal mood and affect.    MAU Course  Procedures  MDM  NST 140/Mod/+accels, no decels Toco Quiet  Assessment and Plan  Shannon Jarvis is a 30 y.o. G2P1001 at 527w6d presenting with abdominal pain in the upper quadrants associated with diarrhea - encouraged PO intake - safe to use immodium  Shannon Jarvis 02/09/2016, 12:13 AM

## 2016-02-09 NOTE — Discharge Instructions (Signed)

## 2016-02-19 ENCOUNTER — Other Ambulatory Visit (INDEPENDENT_AMBULATORY_CARE_PROVIDER_SITE_OTHER): Payer: Medicaid Other | Admitting: *Deleted

## 2016-02-19 ENCOUNTER — Ambulatory Visit (INDEPENDENT_AMBULATORY_CARE_PROVIDER_SITE_OTHER): Payer: Medicaid Other | Admitting: Obstetrics & Gynecology

## 2016-02-19 VITALS — BP 117/79 | HR 87 | Wt 190.0 lb

## 2016-02-19 DIAGNOSIS — R7309 Other abnormal glucose: Secondary | ICD-10-CM

## 2016-02-19 DIAGNOSIS — R7302 Impaired glucose tolerance (oral): Secondary | ICD-10-CM

## 2016-02-19 DIAGNOSIS — Z23 Encounter for immunization: Secondary | ICD-10-CM

## 2016-02-19 DIAGNOSIS — Z3483 Encounter for supervision of other normal pregnancy, third trimester: Secondary | ICD-10-CM

## 2016-02-19 LAB — GLUCOSE TOLERANCE, 3 HOURS
Glucose 1 Hour: 159
Glucose 2 Hour: 62
Glucose Fasting: 82

## 2016-02-19 NOTE — Progress Notes (Signed)
Subjective:  Shannon Jarvis is a 30 y.o. G2P1001 at 6838w2d being seen today for ongoing prenatal care.  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  Patient reports no complaints.  Contractions: Not present. Vag. Bleeding: None.  Movement: Present. Denies leaking of fluid.   The following portions of the patient's history were reviewed and updated as appropriate: allergies, current medications, past family history, past medical history, past social history, past surgical history and problem list. Problem list updated.  Objective:   Filed Vitals:   02/19/16 0936  BP: 117/79  Pulse: 87  Weight: 190 lb (86.183 kg)    Fetal Status: Fetal Heart Rate (bpm): 138 Fundal Height: 31 cm Movement: Present     General:  Alert, oriented and cooperative. Patient is in no acute distress.  Skin: Skin is warm and dry. No rash noted.   Cardiovascular: Normal heart rate noted  Respiratory: Normal respiratory effort, no problems with respiration noted  Abdomen: Soft, gravid, appropriate for gestational age. Pain/Pressure: Present     Pelvic: Vag. Bleeding: None Vag D/C Character: Thin  Cervical exam deferred        Extremities: Normal range of motion.  Edema: None  Mental Status: Normal mood and affect. Normal behavior. Normal judgment and thought content.   Urinalysis: Urine Protein: Negative Urine Glucose: Negative  Assessment and Plan:  Pregnancy: G2P1001 at 6338w2d  1. Supervision of normal pregnancy, antepartum, third trimester 2. Need for Tdap vaccination - Tdap vaccine greater than or equal to 7yo IM; Standing - Tdap vaccine greater than or equal to 7yo IM Undergoing 3 hr GTT now, will follow up results and manage accordingly. Preterm labor symptoms and general obstetric  precautions including but not limited to vaginal bleeding, contractions, leaking of fluid and fetal movement were reviewed in detail with the patient. Please refer to After Visit Summary for other counseling recommendations.  Return in about 2 weeks (around 03/04/2016) for OB Visit.   Tereso NewcomerUgonna A Dio Giller, MD

## 2016-02-19 NOTE — Progress Notes (Signed)
Pt has poor veins for venipuncture, attempted to draw fasting with venipuncture, unsuccessful.  Will proceed to do 3 hr GTT via CBG checks.

## 2016-02-19 NOTE — Patient Instructions (Signed)
Return to clinic for any scheduled appointments or obstetric concerns, or go to MAU for evaluation  

## 2016-03-04 ENCOUNTER — Encounter: Payer: Self-pay | Admitting: Family Medicine

## 2016-03-04 ENCOUNTER — Ambulatory Visit (INDEPENDENT_AMBULATORY_CARE_PROVIDER_SITE_OTHER): Payer: Medicaid Other | Admitting: Family Medicine

## 2016-03-04 VITALS — BP 129/80 | HR 106 | Wt 193.6 lb

## 2016-03-04 DIAGNOSIS — B009 Herpesviral infection, unspecified: Secondary | ICD-10-CM

## 2016-03-04 DIAGNOSIS — Z3483 Encounter for supervision of other normal pregnancy, third trimester: Secondary | ICD-10-CM

## 2016-03-04 MED ORDER — VALACYCLOVIR HCL 1 G PO TABS: 1000.0000 mg | ORAL_TABLET | Freq: Every day | ORAL | Status: DC

## 2016-03-04 NOTE — Patient Instructions (Signed)
Third Trimester of Pregnancy The third trimester is from week 29 through week 42, months 7 through 9. The third trimester is a time when the fetus is growing rapidly. At the end of the ninth month, the fetus is about 20 inches in length and weighs 6-10 pounds.  BODY CHANGES Your body goes through many changes during pregnancy. The changes vary from woman to woman.   Your weight will continue to increase. You can expect to gain 25-35 pounds (11-16 kg) by the end of the pregnancy.  You may begin to get stretch marks on your hips, abdomen, and breasts.  You may urinate more often because the fetus is moving lower into your pelvis and pressing on your bladder.  You may develop or continue to have heartburn as a result of your pregnancy.  You may develop constipation because certain hormones are causing the muscles that push waste through your intestines to slow down.  You may develop hemorrhoids or swollen, bulging veins (varicose veins).  You may have pelvic pain because of the weight gain and pregnancy hormones relaxing your joints between the bones in your pelvis. Backaches may result from overexertion of the muscles supporting your posture.  You may have changes in your hair. These can include thickening of your hair, rapid growth, and changes in texture. Some women also have hair loss during or after pregnancy, or hair that feels dry or thin. Your hair will most likely return to normal after your baby is born.  Your breasts will continue to grow and be tender. A yellow discharge may leak from your breasts called colostrum.  Your belly button may stick out.  You may feel short of breath because of your expanding uterus.  You may notice the fetus "dropping," or moving lower in your abdomen.  You may have a bloody mucus discharge. This usually occurs a few days to a week before labor begins.  Your cervix becomes thin and soft (effaced) near your due date. WHAT TO EXPECT AT YOUR  PRENATAL EXAMS  You will have prenatal exams every 2 weeks until week 36. Then, you will have weekly prenatal exams. During a routine prenatal visit:  You will be weighed to make sure you and the fetus are growing normally.  Your blood pressure is taken.  Your abdomen will be measured to track your baby's growth.  The fetal heartbeat will be listened to.  Any test results from the previous visit will be discussed.  You may have a cervical check near your due date to see if you have effaced. At around 36 weeks, your caregiver will check your cervix. At the same time, your caregiver will also perform a test on the secretions of the vaginal tissue. This test is to determine if a type of bacteria, Group B streptococcus, is present. Your caregiver will explain this further. Your caregiver may ask you:  What your birth plan is.  How you are feeling.  If you are feeling the baby move.  If you have had any abnormal symptoms, such as leaking fluid, bleeding, severe headaches, or abdominal cramping.  If you are using any tobacco products, including cigarettes, chewing tobacco, and electronic cigarettes.  If you have any questions. Other tests or screenings that may be performed during your third trimester include:  Blood tests that check for low iron levels (anemia).  Fetal testing to check the health, activity level, and growth of the fetus. Testing is done if you have certain medical conditions or if   there are problems during the pregnancy.  HIV (human immunodeficiency virus) testing. If you are at high risk, you may be screened for HIV during your third trimester of pregnancy. FALSE LABOR You may feel small, irregular contractions that eventually go away. These are called Braxton Hicks contractions, or false labor. Contractions may last for hours, days, or even weeks before true labor sets in. If contractions come at regular intervals, intensify, or become painful, it is best to be seen  by your caregiver.  SIGNS OF LABOR   Menstrual-like cramps.  Contractions that are 5 minutes apart or less.  Contractions that start on the top of the uterus and spread down to the lower abdomen and back.  A sense of increased pelvic pressure or back pain.  A watery or bloody mucus discharge that comes from the vagina. If you have any of these signs before the 37th week of pregnancy, call your caregiver right away. You need to go to the hospital to get checked immediately. HOME CARE INSTRUCTIONS   Avoid all smoking, herbs, alcohol, and unprescribed drugs. These chemicals affect the formation and growth of the baby.  Do not use any tobacco products, including cigarettes, chewing tobacco, and electronic cigarettes. If you need help quitting, ask your health care provider. You may receive counseling support and other resources to help you quit.  Follow your caregiver's instructions regarding medicine use. There are medicines that are either safe or unsafe to take during pregnancy.  Exercise only as directed by your caregiver. Experiencing uterine cramps is a good sign to stop exercising.  Continue to eat regular, healthy meals.  Wear a good support bra for breast tenderness.  Do not use hot tubs, steam rooms, or saunas.  Wear your seat belt at all times when driving.  Avoid raw meat, uncooked cheese, cat litter boxes, and soil used by cats. These carry germs that can cause birth defects in the baby.  Take your prenatal vitamins.  Take 1500-2000 mg of calcium daily starting at the 20th week of pregnancy until you deliver your baby.  Try taking a stool softener (if your caregiver approves) if you develop constipation. Eat more high-fiber foods, such as fresh vegetables or fruit and whole grains. Drink plenty of fluids to keep your urine clear or pale yellow.  Take warm sitz baths to soothe any pain or discomfort caused by hemorrhoids. Use hemorrhoid cream if your caregiver  approves.  If you develop varicose veins, wear support hose. Elevate your feet for 15 minutes, 3-4 times a day. Limit salt in your diet.  Avoid heavy lifting, wear low heal shoes, and practice good posture.  Rest a lot with your legs elevated if you have leg cramps or low back pain.  Visit your dentist if you have not gone during your pregnancy. Use a soft toothbrush to brush your teeth and be gentle when you floss.  A sexual relationship may be continued unless your caregiver directs you otherwise.  Do not travel far distances unless it is absolutely necessary and only with the approval of your caregiver.  Take prenatal classes to understand, practice, and ask questions about the labor and delivery.  Make a trial run to the hospital.  Pack your hospital bag.  Prepare the baby's nursery.  Continue to go to all your prenatal visits as directed by your caregiver. SEEK MEDICAL CARE IF:  You are unsure if you are in labor or if your water has broken.  You have dizziness.  You have   mild pelvic cramps, pelvic pressure, or nagging pain in your abdominal area.  You have persistent nausea, vomiting, or diarrhea.  You have a bad smelling vaginal discharge.  You have pain with urination. SEEK IMMEDIATE MEDICAL CARE IF:   You have a fever.  You are leaking fluid from your vagina.  You have spotting or bleeding from your vagina.  You have severe abdominal cramping or pain.  You have rapid weight loss or gain.  You have shortness of breath with chest pain.  You notice sudden or extreme swelling of your face, hands, ankles, feet, or legs.  You have not felt your baby move in over an hour.  You have severe headaches that do not go away with medicine.  You have vision changes.   This information is not intended to replace advice given to you by your health care provider. Make sure you discuss any questions you have with your health care provider.   Document Released:  09/09/2001 Document Revised: 10/06/2014 Document Reviewed: 11/16/2012 Elsevier Interactive Patient Education 2016 [employer]  Breastfeeding Deciding to breastfeed is one of the best choices you can make for you and your baby. A change in hormones during pregnancy causes your breast tissue to grow and increases the number and size of your milk ducts. These hormones also allow proteins, sugars, and fats from your blood supply to make breast milk in your milk-producing glands. Hormones prevent breast milk from being released before your baby is born as well as prompt milk flow after birth. Once breastfeeding has begun, thoughts of your baby, as well as his or her sucking or crying, can stimulate the release of milk from your milk-producing glands.  BENEFITS OF BREASTFEEDING For Your Baby  Your first milk (colostrum) helps your baby's digestive system function better.  There are antibodies in your milk that help your baby fight off infections.  Your baby has a lower incidence of asthma, allergies, and sudden infant death syndrome.  The nutrients in breast milk are better for your baby than infant formulas and are designed uniquely for your baby's needs.  Breast milk improves your baby's brain development.  Your baby is less likely to develop other conditions, such as childhood obesity, asthma, or type 2 diabetes mellitus. For You  Breastfeeding helps to create a very special bond between you and your baby.  Breastfeeding is convenient. Breast milk is always available at the correct temperature and costs nothing.  Breastfeeding helps to burn calories and helps you lose the weight gained during pregnancy.  Breastfeeding makes your uterus contract to its prepregnancy size faster and slows bleeding (lochia) after you give birth.   Breastfeeding helps to lower your risk of developing type 2 diabetes mellitus, osteoporosis, and breast or ovarian cancer later in life. SIGNS THAT YOUR BABY IS  HUNGRY Early Signs of Hunger  Increased alertness or activity.  Stretching.  Movement of the head from side to side.  Movement of the head and opening of the mouth when the corner of the mouth or cheek is stroked (rooting).  Increased sucking sounds, smacking lips, cooing, sighing, or squeaking.  Hand-to-mouth movements.  Increased sucking of fingers or hands. Late Signs of Hunger  Fussing.  Intermittent crying. Extreme Signs of Hunger Signs of extreme hunger will require calming and consoling before your baby will be able to breastfeed successfully. Do not wait for the following signs of extreme hunger to occur before you initiate breastfeeding:  Restlessness.  A loud, strong cry.  Screaming.   BREASTFEEDING BASICS Breastfeeding Initiation  Find a comfortable place to sit or lie down, with your neck and back well supported.  Place a pillow or rolled up blanket under your baby to bring him or her to the level of your breast (if you are seated). Nursing pillows are specially designed to help support your arms and your baby while you breastfeed.  Make sure that your baby's abdomen is facing your abdomen.  Gently massage your breast. With your fingertips, massage from your chest wall toward your nipple in a circular motion. This encourages milk flow. You may need to continue this action during the feeding if your milk flows slowly.  Support your breast with 4 fingers underneath and your thumb above your nipple. Make sure your fingers are well away from your nipple and your baby's mouth.  Stroke your baby's lips gently with your finger or nipple.  When your baby's mouth is open wide enough, quickly bring your baby to your breast, placing your entire nipple and as much of the colored area around your nipple (areola) as possible into your baby's mouth.  More areola should be visible above your baby's upper lip than below the lower lip.  Your baby's tongue should be between his  or her lower gum and your breast.  Ensure that your baby's mouth is correctly positioned around your nipple (latched). Your baby's lips should create a seal on your breast and be turned out (everted).  It is common for your baby to suck about 2-3 minutes in order to start the flow of breast milk. Latching Teaching your baby how to latch on to your breast properly is very important. An improper latch can cause nipple pain and decreased milk supply for you and poor weight gain in your baby. Also, if your baby is not latched onto your nipple properly, he or she may swallow some air during feeding. This can make your baby fussy. Burping your baby when you switch breasts during the feeding can help to get rid of the air. However, teaching your baby to latch on properly is still the best way to prevent fussiness from swallowing air while breastfeeding. Signs that your baby has successfully latched on to your nipple:  Silent tugging or silent sucking, without causing you pain.  Swallowing heard between every 3-4 sucks.  Muscle movement above and in front of his or her ears while sucking. Signs that your baby has not successfully latched on to nipple:  Sucking sounds or smacking sounds from your baby while breastfeeding.  Nipple pain. If you think your baby has not latched on correctly, slip your finger into the corner of your baby's mouth to break the suction and place it between your baby's gums. Attempt breastfeeding initiation again. Signs of Successful Breastfeeding Signs from your baby:  A gradual decrease in the number of sucks or complete cessation of sucking.  Falling asleep.  Relaxation of his or her body.  Retention of a small amount of milk in his or her mouth.  Letting go of your breast by himself or herself. Signs from you:  Breasts that have increased in firmness, weight, and size 1-3 hours after feeding.  Breasts that are softer immediately after  breastfeeding.  Increased milk volume, as well as a change in milk consistency and color by the fifth day of breastfeeding.  Nipples that are not sore, cracked, or bleeding. Signs That Your Baby is Getting Enough Milk  Wetting at least 3 diapers in a 24-hour period.   The urine should be clear and pale yellow by age 5 days.  At least 3 stools in a 24-hour period by age 5 days. The stool should be soft and yellow.  At least 3 stools in a 24-hour period by age 7 days. The stool should be seedy and yellow.  No loss of weight greater than 10% of birth weight during the first 3 days of age.  Average weight gain of 4-7 ounces (113-198 g) per week after age 4 days.  Consistent daily weight gain by age 5 days, without weight loss after the age of 2 weeks. After a feeding, your baby may spit up a small amount. This is common. BREASTFEEDING FREQUENCY AND DURATION Frequent feeding will help you make more milk and can prevent sore nipples and breast engorgement. Breastfeed when you feel the need to reduce the fullness of your breasts or when your baby shows signs of hunger. This is called "breastfeeding on demand." Avoid introducing a pacifier to your baby while you are working to establish breastfeeding (the first 4-6 weeks after your baby is born). After this time you may choose to use a pacifier. Research has shown that pacifier use during the first year of a baby's life decreases the risk of sudden infant death syndrome (SIDS). Allow your baby to feed on each breast as long as he or she wants. Breastfeed until your baby is finished feeding. When your baby unlatches or falls asleep while feeding from the first breast, offer the second breast. Because newborns are often sleepy in the first few weeks of life, you may need to awaken your baby to get him or her to feed. Breastfeeding times will vary from baby to baby. However, the following rules can serve as a guide to help you ensure that your baby is  properly fed:  Newborns (babies 4 weeks of age or younger) may breastfeed every 1-3 hours.  Newborns should not go longer than 3 hours during the day or 5 hours during the night without breastfeeding.  You should breastfeed your baby a minimum of 8 times in a 24-hour period until you begin to introduce solid foods to your baby at around 6 months of age. BREAST MILK PUMPING Pumping and storing breast milk allows you to ensure that your baby is exclusively fed your breast milk, even at times when you are unable to breastfeed. This is especially important if you are going back to work while you are still breastfeeding or when you are not able to be present during feedings. Your lactation consultant can give you guidelines on how long it is safe to store breast milk. A breast pump is a machine that allows you to pump milk from your breast into a sterile bottle. The pumped breast milk can then be stored in a refrigerator or freezer. Some breast pumps are operated by hand, while others use electricity. Ask your lactation consultant which type will work best for you. Breast pumps can be purchased, but some hospitals and breastfeeding support groups lease breast pumps on a monthly basis. A lactation consultant can teach you how to hand express breast milk, if you prefer not to use a pump. CARING FOR YOUR BREASTS WHILE YOU BREASTFEED Nipples can become dry, cracked, and sore while breastfeeding. The following recommendations can help keep your breasts moisturized and healthy:  Avoid using soap on your nipples.  Wear a supportive bra. Although not required, special nursing bras and tank tops are designed to allow access to your   breasts for breastfeeding without taking off your entire bra or top. Avoid wearing underwire-style bras or extremely tight bras.  Air dry your nipples for 3-4minutes after each feeding.  Use only cotton bra pads to absorb leaked breast milk. Leaking of breast milk between feedings  is normal.  Use lanolin on your nipples after breastfeeding. Lanolin helps to maintain your skin's normal moisture barrier. If you use pure lanolin, you do not need to wash it off before feeding your baby again. Pure lanolin is not toxic to your baby. You may also hand express a few drops of breast milk and gently massage that milk into your nipples and allow the milk to air dry. In the first few weeks after giving birth, some women experience extremely full breasts (engorgement). Engorgement can make your breasts feel heavy, warm, and tender to the touch. Engorgement peaks within 3-5 days after you give birth. The following recommendations can help ease engorgement:  Completely empty your breasts while breastfeeding or pumping. You may want to start by applying warm, moist heat (in the shower or with warm water-soaked hand towels) just before feeding or pumping. This increases circulation and helps the milk flow. If your baby does not completely empty your breasts while breastfeeding, pump any extra milk after he or she is finished.  Wear a snug bra (nursing or regular) or tank top for 1-2 days to signal your body to slightly decrease milk production.  Apply ice packs to your breasts, unless this is too uncomfortable for you.  Make sure that your baby is latched on and positioned properly while breastfeeding. If engorgement persists after 48 hours of following these recommendations, contact your health care provider or a lactation consultant. OVERALL HEALTH CARE RECOMMENDATIONS WHILE BREASTFEEDING  Eat healthy foods. Alternate between meals and snacks, eating 3 of each per day. Because what you eat affects your breast milk, some of the foods may make your baby more irritable than usual. Avoid eating these foods if you are sure that they are negatively affecting your baby.  Drink milk, fruit juice, and water to satisfy your thirst (about 10 glasses a day).  Rest often, relax, and continue to take  your prenatal vitamins to prevent fatigue, stress, and anemia.  Continue breast self-awareness checks.  Avoid chewing and smoking tobacco. Chemicals from cigarettes that pass into breast milk and exposure to secondhand smoke may harm your baby.  Avoid alcohol and drug use, including marijuana. Some medicines that may be harmful to your baby can pass through breast milk. It is important to ask your health care provider before taking any medicine, including all over-the-counter and prescription medicine as well as vitamin and herbal supplements. It is possible to become pregnant while breastfeeding. If birth control is desired, ask your health care provider about options that will be safe for your baby. SEEK MEDICAL CARE IF:  You feel like you want to stop breastfeeding or have become frustrated with breastfeeding.  You have painful breasts or nipples.  Your nipples are cracked or bleeding.  Your breasts are red, tender, or warm.  You have a swollen area on either breast.  You have a fever or chills.  You have nausea or vomiting.  You have drainage other than breast milk from your nipples.  Your breasts do not become full before feedings by the fifth day after you give birth.  You feel sad and depressed.  Your baby is too sleepy to eat well.  Your baby is having trouble sleeping.     Your baby is wetting less than 3 diapers in a 24-hour period.  Your baby has less than 3 stools in a 24-hour period.  Your baby's skin or the white part of his or her eyes becomes yellow.   Your baby is not gaining weight by 5 days of age. SEEK IMMEDIATE MEDICAL CARE IF:  Your baby is overly tired (lethargic) and does not want to wake up and feed.  Your baby develops an unexplained fever.   This information is not intended to replace advice given to you by your health care provider. Make sure you discuss any questions you have with your health care provider.   Document Released: 09/15/2005  Document Revised: 06/06/2015 Document Reviewed: 03/09/2013 Elsevier Interactive Patient Education 2016 [employer]  

## 2016-03-05 ENCOUNTER — Encounter: Payer: Self-pay | Admitting: *Deleted

## 2016-03-05 NOTE — Progress Notes (Signed)
Subjective:  Shannon Jarvis is a 30 y.o. G2P1001 at 1826w3d being seen today for ongoing prenatal care.  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  Patient reports occasional floaters.  Contractions: Irregular. Vag. Bleeding: None.  Movement: Present. Denies leaking of fluid.   The following portions of the patient's history were reviewed and updated as appropriate: allergies, current medications, past family history, past medical history, past social history, past surgical history and problem list. Problem list updated.  Objective:   Filed Vitals:   03/04/16 1439  BP: 129/80  Pulse: 106  Weight: 193 lb 9.6 oz (87.816 kg)    Fetal Status: Fetal Heart Rate (bpm): 165 Fundal Height: 33 cm Movement: Present     General:  Alert, oriented and cooperative. Patient is in no acute distress.  Skin: Skin is warm and dry. No rash noted.   Cardiovascular: Normal heart rate noted  Respiratory: Normal respiratory effort, no problems with respiration noted  Abdomen: Soft, gravid, appropriate for gestational age. Pain/Pressure: Present     Pelvic: Vag. Bleeding: None Vag D/C Character: Thin   Cervical exam deferred        Extremities: Normal range of motion.  Edema: Trace  Mental Status: Normal mood and affect. Normal behavior. Normal judgment and thought content.   Urinalysis: Urine Protein: 1+ Urine Glucose: Negative  Assessment and Plan:  Pregnancy: G2P1001 at 4926w3d  1. Supervision of normal pregnancy, antepartum, third trimester Continue routine prenatal care. Checking BP at home with few elevations. 1+ protein today. Notes hand swelling Pre-eclampsia precautions given.  2. 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 Please refer to After Visit Summary for other counseling recommendations.  Return in 2 weeks (on 03/18/2016).   Reva Boresanya S Chayna Surratt, MD

## 2016-03-18 ENCOUNTER — Ambulatory Visit (INDEPENDENT_AMBULATORY_CARE_PROVIDER_SITE_OTHER): Payer: Medicaid Other | Admitting: Obstetrics and Gynecology

## 2016-03-18 VITALS — BP 113/77 | HR 77 | Wt 197.0 lb

## 2016-03-18 DIAGNOSIS — O09293 Supervision of pregnancy with other poor reproductive or obstetric history, third trimester: Secondary | ICD-10-CM

## 2016-03-18 DIAGNOSIS — Z3483 Encounter for supervision of other normal pregnancy, third trimester: Secondary | ICD-10-CM

## 2016-03-18 DIAGNOSIS — B009 Herpesviral infection, unspecified: Secondary | ICD-10-CM

## 2016-03-18 NOTE — Progress Notes (Signed)
Subjective:  Shannon Jarvis is a 30 y.o. G2P1001 at 401w2d being seen today for ongoing prenatal care.  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  Patient reports no complaints.  Contractions: Irregular. Vag. Bleeding: None.  Movement: Present. Denies leaking of fluid.   The following portions of the patient's history were reviewed and updated as appropriate: allergies, current medications, past family history, past medical history, past social history, past surgical history and problem list. Problem list updated.  Objective:   Filed Vitals:   03/18/16 1346  BP: 113/77  Pulse: 77  Weight: 197 lb (89.359 kg)    Fetal Status: Fetal Heart Rate (bpm): 160 Fundal Height: 35 cm Movement: Present     General:  Alert, oriented and cooperative. Patient is in no acute distress.  Skin: Skin is warm and dry. No rash noted.   Cardiovascular: Normal heart rate noted  Respiratory: Normal respiratory effort, no problems with respiration noted  Abdomen: Soft, gravid, appropriate for gestational age. Pain/Pressure: Present     Pelvic: Cervical exam deferred        Extremities: Normal range of motion.  Edema: Trace  Mental Status: Normal mood and affect. Normal behavior. Normal judgment and thought content.   Urinalysis: Urine Protein: Trace Urine Glucose: Negative  Assessment and Plan:  Pregnancy: G2P1001 at 481w2d  1. Supervision of normal pregnancy, antepartum, third trimester Patient is doing well Cultures next visits  2. History of pre-eclampsia in prior pregnancy, currently pregnant, third trimester Patient never took the ASA SJe checks her BP at home a few times a week and reports BP at times in 140's/80's. She is asymptomatic Preeclampsia precautions reviewed.  Advised to continue monitoring BP  3. 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 Please refer to After Visit Summary for other counseling recommendations.  Return in about 1 week (around 03/25/2016).   Catalina AntiguaPeggy Isidor Bromell, MD

## 2016-03-25 ENCOUNTER — Other Ambulatory Visit (HOSPITAL_COMMUNITY)
Admission: RE | Admit: 2016-03-25 | Discharge: 2016-03-25 | Disposition: A | Discharge status: Home / Self Care | Payer: Medicaid Other | Source: Ambulatory Visit | Attending: Obstetrics & Gynecology | Admitting: Obstetrics & Gynecology

## 2016-03-25 ENCOUNTER — Ambulatory Visit (INDEPENDENT_AMBULATORY_CARE_PROVIDER_SITE_OTHER): Payer: Medicaid Other | Admitting: Obstetrics & Gynecology

## 2016-03-25 VITALS — BP 126/80 | HR 90 | Wt 200.0 lb

## 2016-03-25 DIAGNOSIS — Z113 Encounter for screening for infections with a predominantly sexual mode of transmission: Secondary | ICD-10-CM | POA: Insufficient documentation

## 2016-03-25 DIAGNOSIS — Z880 Allergy status to penicillin: Secondary | ICD-10-CM

## 2016-03-25 DIAGNOSIS — Z3483 Encounter for supervision of other normal pregnancy, third trimester: Secondary | ICD-10-CM

## 2016-03-25 DIAGNOSIS — Z36 Encounter for antenatal screening of mother: Secondary | ICD-10-CM

## 2016-03-25 NOTE — Patient Instructions (Signed)
Return to clinic for any scheduled appointments or obstetric concerns, or go to MAU for evaluation  

## 2016-03-25 NOTE — Progress Notes (Signed)
Subjective:  Shannon Jarvis is a 30 y.o. G2P1001 at 302w2d being seen today for ongoing prenatal care.  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  Patient reports no complaints.  Contractions: Irregular. Vag. Bleeding: None.  Movement: Present. Denies leaking of fluid.   The following portions of the patient's history were reviewed and updated as appropriate: allergies, current medications, past family history, past medical history, past social history, past surgical history and problem list. Problem list updated.  Objective:   Filed Vitals:   03/25/16 0843  BP: 126/80  Pulse: 90  Weight: 200 lb (90.719 kg)    Fetal Status: Fetal Heart Rate (bpm): 138 Fundal Height: 37 cm Movement: Present  Presentation: Vertex  General:  Alert, oriented and cooperative. Patient is in no acute distress.  Skin: Skin is warm and dry. No rash noted.   Cardiovascular: Normal heart rate noted  Respiratory: Normal respiratory effort, no problems with respiration noted  Abdomen: Soft, gravid, appropriate for gestational age. Pain/Pressure: Present     Pelvic: Cervical exam performed Dilation: 2 Effacement (%): 50 Station: Ballotable  Extremities: Normal range of motion.  Edema: Trace  Mental Status: Normal mood and affect. Normal behavior. Normal judgment and thought content.   Urinalysis: Urine Protein: Negative Urine Glucose: Negative  Assessment and Plan:  Pregnancy: G2P1001 at 432w2d  1. Supervision of normal pregnancy, antepartum, third trimester 2. Penicillin allergy - Culture, Grp B Strep w/Rflx Suscept - GC/Chlamydia probe amp ([place])not [facility]  Preterm labor symptoms and general obstetric precautions including but not limited to vaginal bleeding, contractions,  leaking of fluid and fetal movement were reviewed in detail with the patient. Please refer to After Visit Summary for other counseling recommendations.  Return in about 1 week (around 04/01/2016) for OB Visit.   Tereso NewcomerUgonna A Lelania Bia, MD

## 2016-03-26 ENCOUNTER — Telehealth: Payer: Self-pay | Admitting: *Deleted

## 2016-03-26 LAB — GC/CHLAMYDIA PROBE AMP (~~LOC~~) NOT AT ARMC
Chlamydia: NEGATIVE
Neisseria Gonorrhea: NEGATIVE

## 2016-03-26 NOTE — Telephone Encounter (Signed)
Pt called concerned about the care she has been receiving in regards to how we have been monitoring and treating her pregnancy and history of preeclampsia.  Feels that she should be induced in a few weeks to prevent the same event that happened the last time she was pregnant and developed preeclampsia.  Reassured pt that our physicians were familiar with high risk pregnancies and that they are following the recommended guidelines on treatment per ACOG guidelines.  Pt states she is having swelling and had told providers in the past about occasional blurred vision and that the physicians would just smile and she felt as if she was not being taken seriously and is very concerned.  Informed her that her last BP reading was 126/80 and she had negative protein in her urine.  Offered patient to reschedule appt to this week to speak to the physician about her concerns or that she could come in for preeclampsia labs and we could check them as well to make sure everything was good at this time.  Pt declined and stated she could not do that due to transportation issues. Informed pt to call the office if she had any changes, pt acknowledged.

## 2016-03-27 ENCOUNTER — Encounter: Payer: Self-pay | Admitting: Obstetrics & Gynecology

## 2016-03-27 DIAGNOSIS — O99820 Streptococcus B carrier state complicating pregnancy: Secondary | ICD-10-CM | POA: Insufficient documentation

## 2016-03-27 LAB — OB RESULTS CONSOLE GBS: GBS: POSITIVE

## 2016-03-29 LAB — CULTURE, STREPTOCOCCUS GRP B W/SUSCEPT

## 2016-04-02 ENCOUNTER — Ambulatory Visit (INDEPENDENT_AMBULATORY_CARE_PROVIDER_SITE_OTHER): Payer: Medicaid Other | Admitting: Obstetrics and Gynecology

## 2016-04-02 VITALS — BP 119/80 | HR 105 | Wt 203.0 lb

## 2016-04-02 DIAGNOSIS — O99119 Other diseases of the blood and blood-forming organs and certain disorders involving the immune mechanism complicating pregnancy, unspecified trimester: Secondary | ICD-10-CM

## 2016-04-02 DIAGNOSIS — D696 Thrombocytopenia, unspecified: Secondary | ICD-10-CM | POA: Diagnosis not present

## 2016-04-02 DIAGNOSIS — B009 Herpesviral infection, unspecified: Secondary | ICD-10-CM

## 2016-04-02 DIAGNOSIS — Z3483 Encounter for supervision of other normal pregnancy, third trimester: Secondary | ICD-10-CM

## 2016-04-02 DIAGNOSIS — O99820 Streptococcus B carrier state complicating pregnancy: Secondary | ICD-10-CM

## 2016-04-02 DIAGNOSIS — Z22330 Carrier of Group B streptococcus: Secondary | ICD-10-CM

## 2016-04-02 DIAGNOSIS — O99113 Other diseases of the blood and blood-forming organs and certain disorders involving the immune mechanism complicating pregnancy, third trimester: Secondary | ICD-10-CM

## 2016-04-02 DIAGNOSIS — O09293 Supervision of pregnancy with other poor reproductive or obstetric history, third trimester: Secondary | ICD-10-CM

## 2016-04-02 LAB — CBC
HEMATOCRIT: 36.2 % (ref 35.0–45.0)
Hemoglobin: 11.7 g/dL (ref 11.7–15.5)
MCH: 31.3 pg (ref 27.0–33.0)
MCHC: 32.3 g/dL (ref 32.0–36.0)
MCV: 96.8 fL (ref 80.0–100.0)
MPV: 12.5 fL (ref 7.5–12.5)
Platelets: 141 10*3/uL (ref 140–400)
RBC: 3.74 MIL/uL — ABNORMAL LOW (ref 3.80–5.10)
RDW: 13.4 % (ref 11.0–15.0)
WBC: 12.1 10*3/uL — AB (ref 3.8–10.8)

## 2016-04-02 NOTE — Progress Notes (Signed)
Pt states she has blurred vision initially after waking up in the morning and states her BP at home in the morning is 140/90s.

## 2016-04-02 NOTE — Addendum Note (Signed)
Addended by: Gita KudoLASSITER, KRISTEN S on: 04/02/2016 11:09 AM   Modules accepted: Medications

## 2016-04-02 NOTE — Progress Notes (Signed)
Subjective:  Shannon Jarvis is a 30 y.o. G2P1001 at 6283w3d being seen today for ongoing prenatal care.  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  Patient reports low back pain and pressure. She's also upset because she's scared that her pre-x may come on suddenly and is wanting to be induced because she's seen that on pregnancy online forums.  Contractions: Irregular. Vag. Bleeding: None.  Movement: Present. Denies leaking of fluid.   The following portions of the patient's history were reviewed and updated as appropriate: allergies, current medications, past family history, past medical history, past social history, past surgical history and problem list. Problem list updated.  Objective:   Filed Vitals:   04/02/16 1024  BP: 119/80  Pulse: 105  Weight: 203 lb (92.08 kg)    Fetal Status: Fetal Heart Rate (bpm): 153   Movement: Present     General:  Alert, oriented and cooperative. Patient is in no acute distress.  Skin: Skin is warm and dry. No rash noted.   Cardiovascular: Normal heart rate noted  Respiratory: Normal respiratory effort, no problems with respiration noted  Abdomen: Soft, gravid, appropriate for gestational age. Pain/Pressure: Present     Pelvic:  1/long/high        Extremities: Normal range of motion.  Edema: Trace  Mental Status: Normal mood and affect. Normal behavior. Normal judgment and thought content.   Urinalysis: Urine Protein: Trace Urine Glucose: Negative  Assessment and Plan:  Pregnancy: G2P1001 at 183w3d  1. Gestational thrombocytopenia, third trimester (HCC) Slightly low at 28wks. Repeat CBC today - CBC  2. Group B Streptococcus carrier,  +RV culture, currently pregnant Pos. clinda sx.   3. Supervision of normal pregnancy, antepartum, third trimester D/w pt re: IOL indications. She states that in the morning she has mild range BPs and blurry vision but then it goes away. I told her that the only thing we can do is to have her come in when she has s/s of pre-x for evaluation either in the office or at the [facility]. I tried to reassure her that there is no way to predict pre-x and no change in usual prenatal care with a h/o pre-x except to offer her a baby ASA at 12-14wks, which she states she declined.   4. 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 History of pre-eclampsia in prior pregnancy, currently pregnant, third trimester See above  Term labor symptoms and general obstetric precautions including but not limited to vaginal bleeding, contractions, leaking of fluid and fetal movement were reviewed in detail with the patient. Please refer to After Visit Summary for other counseling recommendations.  RTC 1wk   [place] Bingharlie Aanyah Loa, MD

## 2016-04-05 ENCOUNTER — Inpatient Hospital Stay (HOSPITAL_COMMUNITY): Payer: Medicaid Other

## 2016-04-05 ENCOUNTER — Encounter (HOSPITAL_COMMUNITY): Payer: Self-pay | Admitting: *Deleted

## 2016-04-05 ENCOUNTER — Inpatient Hospital Stay (HOSPITAL_COMMUNITY)
Admission: AD | Admit: 2016-04-05 | Discharge: 2016-04-05 | Disposition: A | Discharge status: Home / Self Care | Payer: Medicaid Other | Source: Ambulatory Visit | Attending: Obstetrics and Gynecology | Admitting: Obstetrics and Gynecology

## 2016-04-05 DIAGNOSIS — Z3A37 37 weeks gestation of pregnancy: Secondary | ICD-10-CM

## 2016-04-05 DIAGNOSIS — O99820 Streptococcus B carrier state complicating pregnancy: Secondary | ICD-10-CM | POA: Diagnosis not present

## 2016-04-05 DIAGNOSIS — O403XX0 Polyhydramnios, third trimester, not applicable or unspecified: Secondary | ICD-10-CM | POA: Diagnosis not present

## 2016-04-05 DIAGNOSIS — O368390 Maternal care for abnormalities of the fetal heart rate or rhythm, unspecified trimester, not applicable or unspecified: Secondary | ICD-10-CM

## 2016-04-05 DIAGNOSIS — O09293 Supervision of pregnancy with other poor reproductive or obstetric history, third trimester: Secondary | ICD-10-CM

## 2016-04-05 DIAGNOSIS — O163 Unspecified maternal hypertension, third trimester: Secondary | ICD-10-CM

## 2016-04-05 DIAGNOSIS — R030 Elevated blood-pressure reading, without diagnosis of hypertension: Secondary | ICD-10-CM | POA: Diagnosis present

## 2016-04-05 DIAGNOSIS — O99113 Other diseases of the blood and blood-forming organs and certain disorders involving the immune mechanism complicating pregnancy, third trimester: Secondary | ICD-10-CM | POA: Diagnosis not present

## 2016-04-05 LAB — COMPREHENSIVE METABOLIC PANEL
ALBUMIN: 2.6 g/dL — AB (ref 3.5–5.0)
ALK PHOS: 115 U/L (ref 38–126)
ALT: 11 U/L — AB (ref 14–54)
ANION GAP: 10 (ref 5–15)
AST: 15 U/L (ref 15–41)
BILIRUBIN TOTAL: 0.3 mg/dL (ref 0.3–1.2)
BUN: 8 mg/dL (ref 6–20)
CALCIUM: 8.6 mg/dL — AB (ref 8.9–10.3)
CO2: 22 mmol/L (ref 22–32)
CREATININE: 0.55 mg/dL (ref 0.44–1.00)
Chloride: 103 mmol/L (ref 101–111)
GFR calc Af Amer: >60 mL/min (ref >60)
GFR calc non Af Amer: >60 mL/min (ref >60)
GLUCOSE: 173 mg/dL — AB (ref 65–99)
Potassium: 3.3 mmol/L — ABNORMAL LOW (ref 3.5–5.1)
SODIUM: 135 mmol/L (ref 135–145)
TOTAL PROTEIN: 6.1 g/dL — AB (ref 6.5–8.1)

## 2016-04-05 LAB — PROTEIN / CREATININE RATIO, URINE
Creatinine, Urine: 190 mg/dL
PROTEIN CREATININE RATIO: 0.16 mg/mg{creat} — AB (ref 0.00–0.15)
Total Protein, Urine: 30 mg/dL

## 2016-04-05 LAB — URINALYSIS, ROUTINE W REFLEX MICROSCOPIC
Bilirubin Urine: NEGATIVE
GLUCOSE, UA: 100 mg/dL — AB
HGB URINE DIPSTICK: NEGATIVE
KETONES UR: 15 mg/dL — AB
Leukocytes, UA: NEGATIVE
Nitrite: NEGATIVE
PH: 5.5 (ref 5.0–8.0)
PROTEIN: NEGATIVE mg/dL
Specific Gravity, Urine: >1.03 — ABNORMAL HIGH (ref 1.005–1.030)

## 2016-04-05 LAB — CBC
HEMATOCRIT: 33.7 % — AB (ref 36.0–46.0)
Hemoglobin: 11.4 g/dL — ABNORMAL LOW (ref 12.0–15.0)
MCH: 31.8 pg (ref 26.0–34.0)
MCHC: 33.8 g/dL (ref 30.0–36.0)
MCV: 94.1 fL (ref 78.0–100.0)
Platelets: 127 10*3/uL — ABNORMAL LOW (ref 150–400)
RBC: 3.58 MIL/uL — AB (ref 3.87–5.11)
RDW: 13.5 % (ref 11.5–15.5)
WBC: 10.9 10*3/uL — ABNORMAL HIGH (ref 4.0–10.5)

## 2016-04-05 NOTE — MAU Note (Signed)
Pt reports she  is having increased b/p  diastolic between [id number]. C/o blurred vision in her left eye today. Feels swollen. C/o some contractions. C/O sciatic pain as well. Very anxious about her b/p. Had pre-eclampsia with her last pregnancy. Feels like her doctor is not evaluating her appropriately.

## 2016-04-05 NOTE — Discharge Instructions (Signed)
Hypertension During Pregnancy  Hypertension, or high blood pressure, is when there is extra pressure inside your blood vessels that carry blood from the heart to the rest of your body (arteries). It can happen at any time in life, including pregnancy. Hypertension during pregnancy can cause problems for you and your baby. Your baby might not weigh as much as he or she should at birth or might be born early (premature). Very bad cases of hypertension during pregnancy can be life-threatening.   Different types of hypertension can occur during pregnancy. These include:  · Chronic hypertension. This happens when a woman has hypertension before pregnancy and it continues during pregnancy.  · Gestational hypertension. This is when hypertension develops during pregnancy.  · Preeclampsia or toxemia of pregnancy. This is a very serious type of hypertension that develops only during pregnancy. It affects the whole body and can be very dangerous for both mother and baby.    Gestational hypertension and preeclampsia usually go away after your baby is born. Your blood pressure will likely stabilize within 6 weeks. Women who have hypertension during pregnancy have a greater chance of developing hypertension later in life or with future pregnancies.  RISK FACTORS  There are certain factors that make it more likely for you to develop hypertension during pregnancy. These include:  · Having hypertension before pregnancy.  · Having hypertension during a previous pregnancy.  · Being overweight.  · Being older than 40 years.  · Being pregnant with more than one baby.  · Having diabetes or kidney problems.  SIGNS AND SYMPTOMS  Chronic and gestational hypertension rarely cause symptoms. Preeclampsia has symptoms, which may include:  · Increased protein in your urine. Your health care provider will check for this at every prenatal visit.  · Swelling of your hands and face.  · Rapid weight gain.  · Headaches.  · Visual changes.  · Being  bothered by light.  · Abdominal pain, especially in the upper right area.  · Chest pain.  · Shortness of breath.  · Increased reflexes.  · Seizures. These occur with a more severe form of preeclampsia, called eclampsia.  DIAGNOSIS   You may be diagnosed with hypertension during a regular prenatal exam. At each prenatal visit, you may have:  · Your blood pressure checked.  · A urine test to check for protein in your urine.  The type of hypertension you are diagnosed with depends on when you developed it. It also depends on your specific blood pressure reading.  · Developing hypertension before 20 weeks of pregnancy is consistent with chronic hypertension.  · Developing hypertension after 20 weeks of pregnancy is consistent with gestational hypertension.  · Hypertension with increased urinary protein is diagnosed as preeclampsia.  · Blood pressure measurements that stay above 160 systolic or 110 diastolic are a sign of severe preeclampsia.  TREATMENT  Treatment for hypertension during pregnancy varies. Treatment depends on the type of hypertension and how serious it is.  · If you take medicine for chronic hypertension, you may need to switch medicines.    Medicines called ACE inhibitors should not be taken during pregnancy.    Low-dose aspirin may be suggested for women who have risk factors for preeclampsia.  · If you have gestational hypertension, you may need to take a blood pressure medicine that is safe during pregnancy. Your health care provider will recommend the correct medicine.  · If you have severe preeclampsia, you may need to be in the hospital. Health care   done to protect you and your baby. The only cure for preeclampsia is delivery.  Your health  care provider may recommend that you take one low-dose aspirin (81 mg) each day to help prevent high blood pressure during your pregnancy if you are at risk for preeclampsia. You may be at risk for preeclampsia if:  You had preeclampsia or eclampsia during a previous pregnancy.  Your baby did not grow as expected during a previous pregnancy.  You experienced preterm birth with a previous pregnancy.  You experienced a separation of the placenta from the uterus (placental abruption) during a previous pregnancy.  You experienced the loss of your baby during a previous pregnancy.  You are pregnant with more than one baby.  You have other medical conditions, such as diabetes or an autoimmune disease. HOME CARE INSTRUCTIONS  Schedule and keep all of your regular prenatal care appointments. This is important.  Take medicines only as directed by your health care provider. Tell your health care provider about all medicines you take.  Eat as little salt as possible.  Get regular exercise.  Do not drink alcohol.  Do not use tobacco products.  Do not drink products with caffeine.  Lie on your left side when resting. SEEK IMMEDIATE MEDICAL CARE IF:  You have severe abdominal pain.  You have sudden swelling in your hands, ankles, or face.  You gain 4 pounds (1.8 kg) or more in 1 week.  You vomit repeatedly.  You have vaginal bleeding.  You do not feel your baby moving as much.  You have a headache.  You have blurred or double vision.  You have muscle twitching or spasms.  You have shortness of breath.  You have blue fingernails or lips.  You have blood in your urine. MAKE SURE YOU:  Understand these instructions.  Will watch your condition.  Will get help right away if you are not doing well or get worse.   This information is not intended to replace advice given to you by your health care provider. Make sure you discuss any questions you have with your health care  provider.   Document Released: 06/03/2011 Document Revised: 10/06/2014 Document Reviewed: 04/14/2013 Elsevier Interactive Patient Education [employer]2016 [employer]  Polyhydramnios When a woman becomes pregnant, a sac is formed around the fertilized egg (embryo) and later the growing baby (fetus). This sac is called the amniotic sac. The amniotic sac is filled with fluid. It gets bigger as the pregnancy grows. When there is too much fluid in the sac, it is called polyhydramnios. All babies born with polyhydramnios should be looked at for congenital abnormalities. The amniotic fluid cushions and protects the baby. It also provides the baby with fluids and is crucial to normal development. Your baby breathes this fluid into its lungs and swallows it. This helps promote the healthy growth of the lungs and gastrointestinal tract. Amniotic fluid also helps the baby move around, helping with the normal development of muscle and bone.  CAUSES   Diabetes mellitus.  Downs Syndrome, fetal abnormalities of the intestinal tract and anencephaly (the fetus has no brain) can prevent the fetus from swallowing the amniotic fluid.  One twins passes (transfuses) their blood into the other twin (twin-twin transfusion syndrome).  Medical illness of the mother or heart.  Kidney disease.  Tumor (chorioangioma) of the placenta. SYMPTOMS   The uterus enlarges beyond the size it should be for that particular time of the pregnancy.  The mother may feel more pressure and  discomfort than should be expected.  The mother may notice a quick and unexpected enlargement of her stomach. DIAGNOSIS   Your health care provider notices your uterus is beyond the size that is consistent with the time of the pregnancy when he or she measures you.  An ultrasound is then used (abdominally or vaginally) to see if there are twins or more, measure the growth of the baby, looks for birth defects and measures the amount of fluid in the  amniotic sac.  Amniotic Fluid Index (AFI). AFI measures the amount of fluid in the amniotic sac in four different areas. If there is more than 24 centimeters, you have polyhydramnios. TREATMENT   Removing some fluid from the amniotic sac.  Take medications that lower the fluids in your body.  Stop using salt or salty foods because it causes you to keep fluid in your body (retention).  If your health care provider thinks you have polyhydramnios, you will likely need more testing. You will be watched for the rest of your pregnancy. HOME CARE INSTRUCTIONS   Keep all your prenatal appointments. Follow your health care provider's recommendations.  Do not eat a lot of salt and salty foods.  If you have diabetes, keep in it control.  If you have heart or kidney disease, treat the disease as advised by your health care provider. SEEK MEDICAL CARE IF:   You think your uterus has grown too fast in a short period of time.  You feel a great amount of pressure in your lower belly (pelvis) and are more uncomfortable than expected. SEEK IMMEDIATE MEDICAL CARE IF:   You have a gush of fluid or are leaking fluid from your vagina.  You stop feeling the baby move.  You do not feel the baby kicking as much as usual.  You have a hard time keeping your diabetes under control.  You are having problems with your heart or kidney disease.   This information is not intended to replace advice given to you by your health care provider. Make sure you discuss any questions you have with your health care provider.   Document Released: 12/06/2002 Document Revised: 07/06/2013 Document Reviewed: 04/14/2013 Elsevier Interactive Patient Education [employer]

## 2016-04-05 NOTE — MAU Provider Note (Signed)
Chief Complaint:  Contractions and Hypertension   First Provider Initiated Contact with Patient 04/05/16 1649      HPI: Clarann Helvey Moehring is a 30 y.o. G2P1001 at [redacted]w[redacted]d who presents to maternity admissions reporting concern for preeclampsia due to the following: 1. Elevated blood pressure at home with diastolics in the 90s-101. 2. Waking up with foggy vision for the past 4 weeks. Vision normalizes within a few hours after waking up. Concerned because vision has stayed foggy in her left eye all day today, particularly in her outer field of vision. 3. Increased edema in feet and hands. 4. History of preeclampsia in previous pregnancy. States this is what it felt like when she developed preeclampsia last time. 5  Patient states she spoke to one of the doctors from her previous practice who told her that they would have induced her labor already.  No elevated blood pressures at office visits or MAU visits this pregnancy. Patient was noted to have mild thrombocytopenia of 135 K in May 2017. CBC repeated 04/02/2016--platelets stable at 141K. States that she doesn't think her providers are taking her symptoms seriously enough. Concerned because she did not have a baseline 24-hour urine this pregnancy.  Vision change Location: Left eye Quality: Foggy Severity: Mild Duration: 4 weeks Context: Upon waking although it has persisted today Timing: Intermittent Modifying factors: Improves few hours after waking except for today Associated signs and symptoms: Negative for headache, epigastric pain, eye pain, eye drainage.  Denies contractions, leakage of fluid or vaginal bleeding. Good fetal movement.   Patient Active Problem List   Diagnosis Date Noted  . Gestational thrombocytopenia (HCC) 04/02/2016  . Group B Streptococcus carrier, +RV culture, currently pregnant 03/27/2016  . Supervision of normal pregnancy, antepartum 10/09/2015  . 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  History of pre-eclampsia in prior pregnancy, currently pregnant 11/01/2013   Past Medical History: Past Medical History  Diagnosis Date  . Genital herpes   . Pregnancy induced hypertension     Past obstetric history: OB History  Gravida Para Term Preterm AB SAB TAB Ectopic Multiple Living  # Outcome Date GA Lbr Len/2nd Weight Sex Delivery Anes PTL Lv  2 Current           1 Term 11/02/13 [redacted]w[redacted]d 10:54 / 01:43 6 lb 10.7 oz (3.025 kg) M Vag-Spont EPI  Y     Comments: WNL      Past Surgical History: Past Surgical History  Procedure Laterality Date  . Tonsillectomy    . Wisdom tooth extraction  07/10/2015     Family History: Family History  Problem Relation Age of Onset  . Lupus Maternal Aunt   . Rheum arthritis Mother   . Hypertension Father   . Cancer Paternal Grandmother     Breast  . Lupus Cousin     Social History: Social History  Substance Use Topics  . Smoking status: Former Smoker -- 0.00 packs/day    Types: Cigarettes    Quit date: 10/03/2015  . Smokeless tobacco: Never Used  . Alcohol Use: No    Allergies:  Allergies  Allergen Reactions  . Augmentin [Amoxicillin-Pot Clavulanate] Hives and Other (See Comments)    Joint swelling Has patient had a PCN reaction causing immediate rash, facial/tongue/throat swelling, SOB or lightheadedness with hypotension: Yes Has patient had a PCN reaction causing severe rash involving mucus membranes or skin necrosis: Yes Has patient  had a PCN reaction that required hospitalization Yes Has patient had a PCN reaction occurring within the last 10 years: No If all of the above answers are "NO", then may proceed with Cephalosporin use.     Meds:  Prescriptions prior to admission  Medication Sig Dispense Refill Last Dose  . acetaminophen (TYLENOL) 325 MG tablet Take 325 mg by mouth every 6 (six) hours as needed for headache. Reported on 10/09/2015   Past Month at Unknown time  . calcium carbonate (TUMS -  DOSED IN MG ELEMENTAL CALCIUM) 500 MG chewable tablet Chew 2 tablets by mouth 3 (three) times daily as needed for heartburn.    Past Week at Unknown time  . ranitidine (ZANTAC) 150 MG capsule Take 150 mg by mouth every evening.   04/04/2016 at Unknown time  . valACYclovir (VALTREX) 1000 MG tablet Take 1 tablet (1,000 mg total) by mouth daily. 30 tablet 2 04/04/2016 at Unknown time    I have reviewed patient's Past Medical Hx, Surgical Hx, Family Hx, Social Hx, medications and allergies.   ROS:  Review of Systems  Constitutional: Negative for fever and chills.  Eyes: Positive for visual disturbance. Negative for photophobia (foggy vision), pain, discharge, redness and itching.  Cardiovascular: Positive for leg swelling.  Gastrointestinal: Negative for abdominal pain.  Genitourinary: Negative for vaginal bleeding.  Neurological: Negative for headaches.    Physical Exam   Patient Vitals for the past 24 hrs:  BP Temp Pulse Resp SpO2 Height Weight  04/05/16 1800 117/69 mmHg - 99 - 98 % - -  04/05/16 1745 117/82 mmHg - 100 - - - -  04/05/16 1730 119/76 mmHg - 104 - - - -  04/05/16 1715 126/76 mmHg - 104 - - - -  04/05/16 1700 145/85 mmHg - 99 - - - -  04/05/16 1645 128/79 mmHg - 111 - - - -  04/05/16 1630 139/80 mmHg - 116 - - - -  04/05/16 1625 131/79 mmHg - 117 - - - -  04/05/16 1616 131/80 mmHg 98.8 F (37.1 C) (!) 123 18 - 5\' 5"  (1.651 m) 207 lb 12.8 oz (94.257 kg)   Constitutional: Well-developed, well-nourished female in no acute distress. Very anxious. Cardiovascular: Mild tachycardia Respiratory: normal effort GI: Abd soft, non-tender, gravid appropriate for gestational age.  MS: Extremities nontender, 2+ pedal edema, normal ROM Neurologic: Alert and oriented x 4.  GU:  Dilation: 1.5 Effacement (%): 80 Cervical Position: Anterior Station: -2 Presentation: Vertex Exam by:: K.Wilson,RN  FHT:  Baseline 150 , moderate variability, accelerations present, one two-minute variable  deceleration. Reactive on prolonged monitoring afterward. Contractions: Irregular, mild   Labs: Results for orders placed or performed during the hospital encounter of 04/05/16 (from the past 24 hour(s))  Urinalysis, Routine w reflex microscopic (not at [facility])     Status: Abnormal   Collection Time: 04/05/16  4:00 PM  Result Value Ref Range   Color, Urine YELLOW YELLOW   APPearance CLEAR CLEAR   Specific Gravity, Urine >1.030 (H) 1.005 - 1.030   pH 5.5 5.0 - 8.0   Glucose, UA 100 (A) NEGATIVE mg/dL   Hgb urine dipstick NEGATIVE NEGATIVE   Bilirubin Urine NEGATIVE NEGATIVE   Ketones, ur 15 (A) NEGATIVE mg/dL   Protein, ur NEGATIVE NEGATIVE mg/dL   Nitrite NEGATIVE NEGATIVE   Leukocytes, UA NEGATIVE NEGATIVE  Protein / creatinine ratio, urine     Status: Abnormal   Collection Time: 04/05/16  4:00 PM  Result Value Ref Range  Creatinine, Urine 190.00 mg/dL   Total Protein, Urine 30 mg/dL   Protein Creatinine Ratio 0.16 (H) 0.00 - 0.15 mg/mg[Cre]  CBC     Status: Abnormal   Collection Time: 04/05/16  4:49 PM  Result Value Ref Range   WBC 10.9 (H) 4.0 - 10.5 K/uL   RBC 3.58 (L) 3.87 - 5.11 MIL/uL   Hemoglobin 11.4 (L) 12.0 - 15.0 g/dL   HCT 16.1 (L) 09.6 - 04.5 %   MCV 94.1 78.0 - 100.0 fL   MCH 31.8 26.0 - 34.0 pg   MCHC 33.8 30.0 - 36.0 g/dL   RDW 40.9 81.1 - 91.4 %   Platelets 127 (L) 150 - 400 K/uL  Comprehensive metabolic panel     Status: Abnormal   Collection Time: 04/05/16  4:49 PM  Result Value Ref Range   Sodium 135 135 - 145 mmol/L   Potassium 3.3 (L) 3.5 - 5.1 mmol/L   Chloride 103 101 - 111 mmol/L   CO2 22 22 - 32 mmol/L   Glucose, Bld 173 (H) 65 - 99 mg/dL   BUN 8 6 - 20 mg/dL   Creatinine, Ser 7.82 0.44 - 1.00 mg/dL   Calcium 8.6 (L) 8.9 - 10.3 mg/dL   Total Protein 6.1 (L) 6.5 - 8.1 g/dL   Albumin 2.6 (L) 3.5 - 5.0 g/dL   AST 15 15 - 41 U/L   ALT 11 (L) 14 - 54 U/L   Alkaline Phosphatase 115 38 - 126 U/L   Total Bilirubin 0.3 0.3 - 1.2 mg/dL   GFR calc  non Af Amer >60 >60 mL/min   GFR calc Af Amer >60 >60 mL/min   Anion gap 10 5 - 15    Imaging:  BPP 8/8. AFI 31 cm--> polyhydramnios  MAU Course: Cycle blood pressures, CBC, CMP, UA, protein creatinine ratio.  Discussed history, blood pressures at home, blood pressures in MAU, labs, variable deceleration with Dr. Vergie Living. BPP ordered.   BPP 8/8. Lengthy discussion with patient about diagnostic criteria for preeclampsia and indications for delivery. Patient very relieved by discussion of plan of care and testing today.  MDM:  Assessment: 1. Hypertension in pregnancy, antepartum, third trimester   2. History of pre-eclampsia in prior pregnancy, currently pregnant, third trimester   3. Variable fetal heart rate decelerations, antepartum   4. Polyhydramnios, third trimester, not applicable or unspecified fetus     Plan: Discharge home in stable condition Per consult with Dr. Vergie Living.  Labor precautions and fetal kick counts. Frequency precautions. Polyhydramnios precautions.     Follow-up Information    Follow up with [facility] On 04/08/2016.   Specialty:  Obstetrics and Gynecology   Why:  Routine prenatal visit or sooner as needed if symptoms worsen   Contact information:   [address] [place] [address] [phone]      Follow up with THE [facility] [place] [facility].   Why:  As needed in emergencies   Contact information:   [address] [id number] mc [place] [address] [phone]        Medication List    TAKE these medications        acetaminophen 325 MG tablet  Commonly known as:  TYLENOL  Take 325 mg by mouth every 6 (six) hours as needed for headache. Reported on 10/09/2015     calcium carbonate 500 MG chewable tablet  Commonly known as:  TUMS - dosed in  mg elemental calcium  Chew 2 tablets by mouth 3 (three) times daily as needed for heartburn.      ranitidine 150 MG capsule  Commonly known as:  ZANTAC  Take 150 mg by mouth every evening.     valACYclovir 1000 MG tablet  Commonly known as:  VALTREX  Take 1 tablet (1,000 mg total) by mouth daily.        [place], [place] 04/05/2016 7:50 PM

## 2016-04-07 ENCOUNTER — Inpatient Hospital Stay (HOSPITAL_COMMUNITY)
Admission: AD | Admit: 2016-04-07 | Discharge: 2016-04-09 | DRG: 774 | Disposition: A | Discharge status: Home / Self Care | Payer: Medicaid Other | Source: Ambulatory Visit | Attending: Obstetrics & Gynecology | Admitting: Obstetrics & Gynecology

## 2016-04-07 ENCOUNTER — Inpatient Hospital Stay (HOSPITAL_COMMUNITY): Payer: Medicaid Other | Admitting: Anesthesiology

## 2016-04-07 ENCOUNTER — Encounter (HOSPITAL_COMMUNITY): Payer: Self-pay

## 2016-04-07 DIAGNOSIS — D6959 Other secondary thrombocytopenia: Secondary | ICD-10-CM | POA: Diagnosis present

## 2016-04-07 DIAGNOSIS — Z3490 Encounter for supervision of normal pregnancy, unspecified, unspecified trimester: Secondary | ICD-10-CM

## 2016-04-07 DIAGNOSIS — Z3483 Encounter for supervision of other normal pregnancy, third trimester: Secondary | ICD-10-CM

## 2016-04-07 DIAGNOSIS — Z87891 Personal history of nicotine dependence: Secondary | ICD-10-CM | POA: Diagnosis not present

## 2016-04-07 DIAGNOSIS — Z3A38 38 weeks gestation of pregnancy: Secondary | ICD-10-CM

## 2016-04-07 DIAGNOSIS — O9832 Other infections with a predominantly sexual mode of transmission complicating childbirth: Secondary | ICD-10-CM | POA: Diagnosis present

## 2016-04-07 DIAGNOSIS — Z8249 Family history of ischemic heart disease and other diseases of the circulatory system: Secondary | ICD-10-CM

## 2016-04-07 DIAGNOSIS — O99824 Streptococcus B carrier state complicating childbirth: Secondary | ICD-10-CM | POA: Diagnosis present

## 2016-04-07 DIAGNOSIS — O99820 Streptococcus B carrier state complicating pregnancy: Secondary | ICD-10-CM

## 2016-04-07 DIAGNOSIS — O403XX0 Polyhydramnios, third trimester, not applicable or unspecified: Principal | ICD-10-CM | POA: Diagnosis present

## 2016-04-07 DIAGNOSIS — O9912 Other diseases of the blood and blood-forming organs and certain disorders involving the immune mechanism complicating childbirth: Secondary | ICD-10-CM | POA: Diagnosis present

## 2016-04-07 DIAGNOSIS — A6000 Herpesviral infection of urogenital system, unspecified: Secondary | ICD-10-CM | POA: Diagnosis present

## 2016-04-07 LAB — URINALYSIS, ROUTINE W REFLEX MICROSCOPIC
BILIRUBIN URINE: NEGATIVE
GLUCOSE, UA: NEGATIVE mg/dL
KETONES UR: NEGATIVE mg/dL
LEUKOCYTES UA: NEGATIVE
NITRITE: NEGATIVE
PH: 6 (ref 5.0–8.0)
Protein, ur: NEGATIVE mg/dL
SPECIFIC GRAVITY, URINE: 1.01 (ref 1.005–1.030)

## 2016-04-07 LAB — CBC
HCT: 34.6 % — ABNORMAL LOW (ref 36.0–46.0)
HCT: 30.7 % — ABNORMAL LOW (ref 36.0–46.0)
Hemoglobin: 11.8 g/dL — ABNORMAL LOW (ref 12.0–15.0)
Hemoglobin: 10.5 g/dL — ABNORMAL LOW (ref 12.0–15.0)
MCH: 32.4 pg (ref 26.0–34.0)
MCH: 32.4 pg (ref 26.0–34.0)
MCHC: 34.1 g/dL (ref 30.0–36.0)
MCHC: 34.2 g/dL (ref 30.0–36.0)
MCV: 95.1 fL (ref 78.0–100.0)
MCV: 94.8 fL (ref 78.0–100.0)
PLATELETS: 141 10*3/uL — AB (ref 150–400)
PLATELETS: 115 10*3/uL — AB (ref 150–400)
RBC: 3.64 MIL/uL — ABNORMAL LOW (ref 3.87–5.11)
RBC: 3.24 MIL/uL — AB (ref 3.87–5.11)
RDW: 13.5 % (ref 11.5–15.5)
RDW: 13.6 % (ref 11.5–15.5)
WBC: 13.7 10*3/uL — AB (ref 4.0–10.5)
WBC: 13.2 10*3/uL — AB (ref 4.0–10.5)

## 2016-04-07 LAB — TYPE AND SCREEN
ABO/RH(D): O POS
ANTIBODY SCREEN: NEGATIVE

## 2016-04-07 LAB — URINE MICROSCOPIC-ADD ON: Bacteria, UA: NONE SEEN

## 2016-04-07 LAB — RPR: RPR Ser Ql: NONREACTIVE

## 2016-04-07 MED ORDER — PRENATAL MULTIVITAMIN CH: 1.0000 | ORAL_TABLET | Freq: Every day | ORAL | Status: DC

## 2016-04-07 MED ORDER — OXYCODONE-ACETAMINOPHEN 5-325 MG PO TABS: 2.0000 | ORAL_TABLET | ORAL | Status: DC | PRN

## 2016-04-07 MED ORDER — ZOLPIDEM TARTRATE 5 MG PO TABS: 5.0000 mg | ORAL_TABLET | Freq: Every evening | ORAL | Status: DC | PRN

## 2016-04-07 MED ORDER — FENTANYL 2.5 MCG/ML BUPIVACAINE 1/10 % EPIDURAL INFUSION (WH - ANES)
14.0000 mL/h | INTRAMUSCULAR | Status: DC | PRN
  Administered 2016-04-07 (×2): 14 mL/h via EPIDURAL
  Filled 2016-04-07 (×2): qty 125

## 2016-04-07 MED ORDER — OXYTOCIN 40 UNITS IN LACTATED RINGERS INFUSION - SIMPLE MED
2.5000 [IU]/h | INTRAVENOUS | Status: DC
  Filled 2016-04-07: qty 1000

## 2016-04-07 MED ORDER — DIBUCAINE 1 % RE OINT: 1.0000 "application " | TOPICAL_OINTMENT | RECTAL | Status: DC | PRN

## 2016-04-07 MED ORDER — IBUPROFEN 600 MG PO TABS
600.0000 mg | ORAL_TABLET | Freq: Four times a day (QID) | ORAL | Status: DC
  Administered 2016-04-07 – 2016-04-09 (×6): 600 mg via ORAL
  Filled 2016-04-07 (×7): qty 1

## 2016-04-07 MED ORDER — LACTATED RINGERS IV SOLN: INTRAVENOUS | Status: DC

## 2016-04-07 MED ORDER — ACETAMINOPHEN 325 MG PO TABS
650.0000 mg | ORAL_TABLET | ORAL | Status: DC | PRN
  Administered 2016-04-07 – 2016-04-08 (×2): 650 mg via ORAL
  Filled 2016-04-07 (×2): qty 2

## 2016-04-07 MED ORDER — LIDOCAINE HCL (PF) 1 % IJ SOLN
INTRAMUSCULAR | Status: DC | PRN
  Administered 2016-04-07 (×2): 5 mL

## 2016-04-07 MED ORDER — LACTATED RINGERS IV SOLN
500.0000 mL | INTRAVENOUS | Status: DC | PRN
Start: 2016-04-07 — End: 2016-04-07
  Administered 2016-04-07: 500 mL via INTRAVENOUS

## 2016-04-07 MED ORDER — PHENYLEPHRINE 40 MCG/ML (10ML) SYRINGE FOR IV PUSH (FOR BLOOD PRESSURE SUPPORT)
80.0000 ug | PREFILLED_SYRINGE | INTRAVENOUS | Status: DC | PRN
  Filled 2016-04-07: qty 5

## 2016-04-07 MED ORDER — DIPHENHYDRAMINE HCL 50 MG/ML IJ SOLN: 12.5000 mg | INTRAMUSCULAR | Status: DC | PRN

## 2016-04-07 MED ORDER — WITCH HAZEL-GLYCERIN EX PADS: 1.0000 "application " | MEDICATED_PAD | CUTANEOUS | Status: DC | PRN

## 2016-04-07 MED ORDER — SOD CITRATE-CITRIC ACID 500-334 MG/5ML PO SOLN
30.0000 mL | ORAL | Status: DC | PRN
  Administered 2016-04-07: 30 mL via ORAL
  Filled 2016-04-07: qty 15

## 2016-04-07 MED ORDER — SIMETHICONE 80 MG PO CHEW
80.0000 mg | CHEWABLE_TABLET | ORAL | Status: DC | PRN
Start: 2016-04-07 — End: 2016-04-09

## 2016-04-07 MED ORDER — COCONUT OIL OIL: 1.0000 "application " | TOPICAL_OIL | Status: DC | PRN

## 2016-04-07 MED ORDER — SENNOSIDES-DOCUSATE SODIUM 8.6-50 MG PO TABS
2.0000 | ORAL_TABLET | ORAL | Status: DC
  Administered 2016-04-08: 2 via ORAL
  Filled 2016-04-07: qty 2

## 2016-04-07 MED ORDER — TETANUS-DIPHTH-ACELL PERTUSSIS 5-2.5-18.5 LF-MCG/0.5 IM SUSP: 0.5000 mL | Freq: Once | INTRAMUSCULAR | Status: DC

## 2016-04-07 MED ORDER — CALCIUM CARBONATE ANTACID 500 MG PO CHEW
2.0000 | CHEWABLE_TABLET | Freq: Three times a day (TID) | ORAL | Status: DC | PRN
  Administered 2016-04-08 (×2): 400 mg via ORAL
  Filled 2016-04-07 (×2): qty 2

## 2016-04-07 MED ORDER — TERBUTALINE SULFATE 1 MG/ML IJ SOLN
0.2500 mg | Freq: Once | INTRAMUSCULAR | Status: DC | PRN
  Filled 2016-04-07: qty 1

## 2016-04-07 MED ORDER — DIPHENHYDRAMINE HCL 25 MG PO CAPS: 25.0000 mg | ORAL_CAPSULE | Freq: Four times a day (QID) | ORAL | Status: DC | PRN

## 2016-04-07 MED ORDER — OXYCODONE-ACETAMINOPHEN 5-325 MG PO TABS: 1.0000 | ORAL_TABLET | ORAL | Status: DC | PRN

## 2016-04-07 MED ORDER — EPHEDRINE 5 MG/ML INJ
10.0000 mg | INTRAVENOUS | Status: DC | PRN
  Filled 2016-04-07: qty 2

## 2016-04-07 MED ORDER — FLEET ENEMA 7-19 GM/118ML RE ENEM: 1.0000 | ENEMA | RECTAL | Status: DC | PRN

## 2016-04-07 MED ORDER — OXYTOCIN 40 UNITS IN LACTATED RINGERS INFUSION - SIMPLE MED
1.0000 m[IU]/min | INTRAVENOUS | Status: DC
  Administered 2016-04-07: 2 m[IU]/min via INTRAVENOUS

## 2016-04-07 MED ORDER — BENZOCAINE-MENTHOL 20-0.5 % EX AERO
1.0000 "application " | INHALATION_SPRAY | CUTANEOUS | Status: DC | PRN
  Administered 2016-04-07: 1 via TOPICAL
  Filled 2016-04-07: qty 56

## 2016-04-07 MED ORDER — PHENYLEPHRINE 40 MCG/ML (10ML) SYRINGE FOR IV PUSH (FOR BLOOD PRESSURE SUPPORT)
80.0000 ug | PREFILLED_SYRINGE | INTRAVENOUS | Status: DC | PRN
  Filled 2016-04-07: qty 5
  Filled 2016-04-07: qty 10

## 2016-04-07 MED ORDER — ONDANSETRON HCL 4 MG/2ML IJ SOLN: 4.0000 mg | INTRAMUSCULAR | Status: DC | PRN

## 2016-04-07 MED ORDER — OXYTOCIN BOLUS FROM INFUSION
500.0000 mL | INTRAVENOUS | Status: DC
  Administered 2016-04-07: 500 mL via INTRAVENOUS

## 2016-04-07 MED ORDER — LACTATED RINGERS IV SOLN
500.0000 mL | Freq: Once | INTRAVENOUS | Status: AC
Start: 2016-04-07 — End: 2016-04-07
  Administered 2016-04-07: 500 mL via INTRAVENOUS

## 2016-04-07 MED ORDER — ONDANSETRON HCL 4 MG PO TABS: 4.0000 mg | ORAL_TABLET | ORAL | Status: DC | PRN

## 2016-04-07 MED ORDER — ACETAMINOPHEN 325 MG PO TABS: 650.0000 mg | ORAL_TABLET | ORAL | Status: DC | PRN

## 2016-04-07 MED ORDER — LIDOCAINE HCL (PF) 1 % IJ SOLN
30.0000 mL | INTRAMUSCULAR | Status: DC | PRN
  Filled 2016-04-07: qty 30

## 2016-04-07 MED ORDER — CLINDAMYCIN PHOSPHATE 900 MG/50ML IV SOLN
900.0000 mg | Freq: Three times a day (TID) | INTRAVENOUS | Status: DC
  Administered 2016-04-07 (×2): 900 mg via INTRAVENOUS
  Filled 2016-04-07 (×3): qty 50

## 2016-04-07 MED ORDER — ONDANSETRON HCL 4 MG/2ML IJ SOLN: 4.0000 mg | Freq: Four times a day (QID) | INTRAMUSCULAR | Status: DC | PRN

## 2016-04-07 NOTE — Progress Notes (Signed)
LABOR PROGRESS NOTE  Shannon Jarvis is a 30 y.o. G3P1011 at 4234w1d  admitted for active labor.  Subjective: Pain well controlled. Nurse did not feel a presenting part on cervical exam. I confirmed vertex via bedside US. Pt nervous about polyhydramnios, reassured.  Objective: BP 105/79 mmHg  Pulse 80  Temp(Src) 98.7 F (37.1 C) (Oral)  Resp 17  Ht 5\' 5"  (1.651 m)  Wt 93.895 kg (207 lb)  BMI 34.45 kg/m2  SpO2 99%  LMP 07/15/2015 (Exact Date) or  Filed Vitals:   04/07/16 0831 04/07/16 0903 04/07/16 0931 04/07/16 1001  BP: 113/63 96/54 94/56  105/79  Pulse: 96 86 80   Temp: 98.7 F (37.1 C)     TempSrc: Oral     Resp: 17 18  17   Height:      Weight:      SpO2:         Dilation: 5 Effacement (%): 90 Cervical Position: Middle Station: -3, Ballotable Presentation: Vertex Exam by:: h stone rnc  Labs: Lab Results  Component Value Date   WBC 13.7* 04/07/2016   HGB 11.8* 04/07/2016   HCT 34.6* 04/07/2016   MCV 95.1 04/07/2016   PLT 141* 04/07/2016    Patient Active Problem List   Diagnosis Date Noted  . Pregnancy 04/07/2016  . Polyhydramnios in third trimester, antepartum 04/05/2016  . Gestational thrombocytopenia (HCC) 04/02/2016  . Group B Streptococcus carrier, +RV culture, currently pregnant 03/27/2016  . Supervision of normal pregnancy, antepartum 10/09/2015  . 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 History of pre-eclampsia in prior pregnancy, currently pregnant 11/01/2013    Assessment / Plan: 30 y.o. G3P1011 at 4834w1d here for active labor.  Labor: starting pit Fetal Wellbeing:  Cat 1 - moderate variability, accels, normal bseline Pain Control:  epidural Anticipated MOD:  SVD  Loni MuseKate Yamir Carignan, MD 04/07/2016, 10:32 AM

## 2016-04-07 NOTE — Anesthesia Procedure Notes (Signed)
Epidural Patient location during procedure: OB  Staffing Anesthesiologist: Almetta Liddicoat Performed by: anesthesiologist   Preanesthetic Checklist Completed: patient identified, site marked, surgical consent, pre-op evaluation, timeout performed, IV checked, risks and benefits discussed and monitors and equipment checked  Epidural Patient position: sitting Prep: DuraPrep Patient monitoring: heart rate, continuous pulse ox and blood pressure Approach: right paramedian Location: L3-L4 Injection technique: LOR saline  Needle:  Needle type: Tuohy  Needle gauge: 17 G Needle length: 9 cm and 9 Needle insertion depth: 6 cm Catheter type: closed end flexible Catheter size: 20 Guage Catheter at skin depth: 10 cm Test dose: negative  Assessment Events: blood not aspirated, injection not painful, no injection resistance, negative IV test and no paresthesia  Additional Notes Patient identified. Risks/Benefits/Options discussed with patient including but not limited to bleeding, infection, nerve damage, paralysis, failed block, incomplete pain control, headache, blood pressure changes, nausea, vomiting, reactions to medication both or allergic, itching and postpartum back pain. Confirmed with bedside nurse the patient's most recent platelet count. Confirmed with patient that they are not currently taking any anticoagulation, have any bleeding history or any family history of bleeding disorders. Patient expressed understanding and wished to proceed. All questions were answered. Sterile technique was used throughout the entire procedure. Please see nursing notes for vital signs. Test dose was given through epidural needle and negative prior to continuing to dose epidural or start infusion. Warning signs of high block given to the patient including shortness of breath, tingling/numbness in hands, complete motor block, or any concerning symptoms with instructions to call for help. Patient was given  instructions on fall risk and not to get out of bed. All questions and concerns addressed with instructions to call with any issues.   

## 2016-04-07 NOTE — Anesthesia Preprocedure Evaluation (Signed)
Anesthesia Evaluation  Patient identified by MRN, date of birth, ID band Patient awake    Reviewed: Allergy & Precautions, H&P , NPO status , Patient's Chart, lab work & pertinent test results  History of Anesthesia Complications Negative for: history of anesthetic complications  Airway Mallampati: II  TM Distance: >3 FB Neck ROM: full    Dental no notable dental hx. (+) Teeth Intact   Pulmonary neg pulmonary ROS, former smoker,    Pulmonary exam normal breath sounds clear to auscultation       Cardiovascular hypertension, negative cardio ROS Normal cardiovascular exam Rhythm:regular Rate:Normal     Neuro/Psych negative neurological ROS  negative psych ROS   GI/Hepatic negative GI ROS, Neg liver ROS,   Endo/Other  negative endocrine ROS  Renal/GU negative Renal ROS  negative genitourinary   Musculoskeletal   Abdominal   Peds  Hematology negative hematology ROS (+)   Anesthesia Other Findings   Reproductive/Obstetrics (+) Pregnancy                             Anesthesia Physical Anesthesia Plan  ASA: II  Anesthesia Plan: Epidural   Post-op Pain Management:    Induction:   Airway Management Planned:   Additional Equipment:   Intra-op Plan:   Post-operative Plan:   Informed Consent: I have reviewed the patients History and Physical, chart, labs and discussed the procedure including the risks, benefits and alternatives for the proposed anesthesia with the patient or authorized representative who has indicated his/her understanding and acceptance.     Plan Discussed with:   Anesthesia Plan Comments:         Anesthesia Quick Evaluation

## 2016-04-07 NOTE — Anesthesia Pain Management Evaluation Note (Signed)
  CRNA Pain Management Visit Note  Patient: Shannon Jarvis, 30 y.o., female  "Hello I am a [facility] of the anesthesia team at [facility]. We have an anesthesia team available at all times to provide care throughout the hospital, including epidural management and anesthesia for C-section. I don't know your plan for the delivery whether it a natural birth, water birth, IV sedation, nitrous supplementation, doula or epidural, but we want to meet your pain goals."   1.Was your pain managed to your expectations on prior hospitalizations?   Yes   2.What is your expectation for pain management during this hospitalization?     Epidural  3.How can we help you reach that goal? Epidural interest.  Jarvis&D RN at bedside and aware of epidural interest.  MDA made aware by CRNA.  Record the patient's initial score and the patient's pain goal.   Pain: 8  Pain Goal: 2 [facility] wants you to be able to say your pain was always managed very well.  Shannon Jarvis 04/07/2016

## 2016-04-07 NOTE — MAU Note (Signed)
Pt c/o contractions since 11pm-are every 10 mins. States she is having some bloody mucous. SVE was 2cm last night. +FM.

## 2016-04-07 NOTE — Progress Notes (Signed)
LABOR PROGRESS NOTE  Shannon Jarvis is a 30 y.o. G3P1011 at 8453w1d  admitted for active labor.  Subjective: Pt comfortable. Nervous about risk of cord prolapse with polyhydramnios.  Objective: BP 94/48 mmHg  Pulse 81  Temp(Src) 99 F (37.2 C) (Oral)  Resp 20  Ht 5\' 5"  (1.651 m)  Wt 93.895 kg (207 lb)  BMI 34.45 kg/m2  SpO2 99%  LMP 07/15/2015 (Exact Date) or  Filed Vitals:   04/07/16 1301 04/07/16 1331 04/07/16 1402 04/07/16 1432  BP: 111/63 126/73 114/60 94/48  Pulse: 97 94 76 81  Temp:    99 F (37.2 C)  TempSrc:    Oral  Resp: 18 20 18 20   Height:      Weight:      SpO2:         Dilation: 6.5 Effacement (%): 90, 100 Cervical Position: Middle Station: BrawleyBallotable, -3 Presentation: Vertex Exam by:: Shannon Jarvis  Labs: Lab Results  Component Value Date   WBC 13.7* 04/07/2016   HGB 11.8* 04/07/2016   HCT 34.6* 04/07/2016   MCV 95.1 04/07/2016   PLT 141* 04/07/2016    Patient Active Problem List   Diagnosis Date Noted  . Pregnancy 04/07/2016  . Polyhydramnios in third trimester, antepartum 04/05/2016  . Gestational thrombocytopenia (HCC) 04/02/2016  . Group B Streptococcus carrier, +RV culture, currently pregnant 03/27/2016  . Supervision of normal pregnancy, antepartum 10/09/2015  . 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 History of pre-eclampsia in prior pregnancy, currently pregnant 11/01/2013    Assessment / Plan: 30 y.o. G3P1011 at 5253w1d here for active labor.  Labor: s/p pit, AROM at 1430 by Shannon. Erin FullingHarraway-Smith Fetal Wellbeing:  Occasional variable Pain Control:  epidural Anticipated MOD:  SVD  Shannon MuseKate Timberlake, MD 04/07/2016, 2:55 PM

## 2016-04-07 NOTE — H&P (Signed)
Shannon Jarvis is a 30 y.o. female G3P1011 @ 38.1 wks presenting for contractions that started at 2300 last night. Denies ROM or vag bleeding. History OB History    Gravida Para Term Preterm AB TAB SAB Ectopic Multiple Living   3 1 1  1 1    1      Past Medical History  Diagnosis Date  . Genital herpes   . Pregnancy induced hypertension    Past Surgical History  Procedure Laterality Date  . Tonsillectomy    . Wisdom tooth extraction  07/10/2015   Family History: family history includes Cancer in her paternal grandmother; Hypertension in her father; Lupus in her cousin and maternal aunt; Rheum arthritis in her mother. Social History:  reports that she quit smoking about 6 months ago. Her smoking use included Cigarettes. She smoked 0.00 packs per day. She has never used smokeless tobacco. She reports that she does not drink alcohol or use illicit drugs.   Prenatal Transfer Tool  Maternal Diabetes: No Genetic Screening: Normal Maternal Ultrasounds/Referrals: Normal Fetal Ultrasounds or other Referrals:  None Maternal Substance Abuse:  No Significant Maternal Medications:  None Significant Maternal Lab Results:  None Other Comments:  None  Review of Systems  Constitutional: Negative.   HENT: Negative.   Eyes: Negative.   Respiratory: Negative.   Cardiovascular: Negative.   Gastrointestinal: Positive for abdominal pain.  Genitourinary: Negative.   Musculoskeletal: Negative.   Skin: Negative.   Neurological: Negative.   Endo/Heme/Allergies: Negative.   Psychiatric/Behavioral: Negative.     Dilation: 5 Effacement (%): 100 Station: -2 Exam by:: PharmacologistAmber Stovall RN Blood pressure 108/71, pulse 93, temperature 98.2 F (36.8 C), temperature source Oral, resp. rate 18, height 5\' 5"  (1.651 m), weight 207 lb (93.895 kg), last menstrual period 07/15/2015, SpO2 99 %, unknown if currently breastfeeding. Maternal Exam:  Uterine Assessment: Contraction strength is moderate.  Contraction  frequency is regular.   Abdomen: Patient reports no abdominal tenderness. Fetal presentation: vertex  Introitus: Normal vulva. Normal vagina.  Amniotic fluid character: not assessed.  Pelvis: adequate for delivery.   Cervix: Cervix evaluated by digital exam.     Fetal Exam Fetal Monitor Review: Mode: ultrasound.   Variability: moderate (6-25 bpm).   Pattern: accelerations present.    Fetal State Assessment: Category I - tracings are normal.     Physical Exam  Constitutional: She is oriented to person, place, and time. She appears well-developed and well-nourished.  HENT:  Head: Normocephalic.  Eyes: Pupils are equal, round, and reactive to light.  Neck: Normal range of motion.  Cardiovascular: Normal rate, regular rhythm, normal heart sounds and intact distal pulses.   Respiratory: Effort normal and breath sounds normal.  GI: Soft. Bowel sounds are normal.  Genitourinary: Vagina normal and uterus normal.  Musculoskeletal: Normal range of motion.  Neurological: She is alert and oriented to person, place, and time. She has normal reflexes.  Skin: Skin is warm and dry.  Psychiatric: She has a normal mood and affect. Her behavior is normal. Judgment and thought content normal.    Prenatal labs: ABO, Rh: O/Positive/-- (12/05 0000) Antibody: Negative (12/05 0000) Rubella: Immune (12/05 0000) RPR: NON REAC (05/09 1156)  HBsAg: Negative (12/05 0000)  HIV: NONREACTIVE (05/09 1156)  GBS: Positive (06/29 0000)   Assessment/Plan: Early active labor.SVE 4-5/90/-1 Pos GBS sensitive to clinda, will admit   Shannon Jarvis 04/07/2016, 7:23 AM

## 2016-04-08 ENCOUNTER — Encounter: Payer: Medicaid Other | Admitting: Obstetrics & Gynecology

## 2016-04-08 ENCOUNTER — Encounter (HOSPITAL_COMMUNITY): Payer: Self-pay

## 2016-04-08 MED ORDER — FAMOTIDINE 20 MG PO TABS
20.0000 mg | ORAL_TABLET | Freq: Two times a day (BID) | ORAL | Status: DC
  Administered 2016-04-08: 20 mg via ORAL
  Filled 2016-04-08: qty 1

## 2016-04-08 MED ORDER — OXYCODONE-ACETAMINOPHEN 5-325 MG PO TABS
1.0000 | ORAL_TABLET | ORAL | Status: DC | PRN
  Administered 2016-04-08: 1 via ORAL
  Filled 2016-04-08: qty 1

## 2016-04-08 NOTE — Progress Notes (Signed)
Post Partum Day 1 Subjective: up ad lib, voiding, tolerating PO and complaining of persistent headache unresponsive to Tylenol and motrin  Objective: Blood pressure 120/63, pulse 87, temperature 97.7 F (36.5 C), temperature source Oral, resp. rate 22, height 5\' 5"  (1.651 m), weight 207 lb (93.895 kg), last menstrual period 07/15/2015, SpO2 99 %, unknown if currently breastfeeding. Filed Vitals:   04/07/16 1830 04/07/16 1930 04/08/16 0044 04/08/16 0530  BP: 114/60 117/59 120/67 120/63  Pulse: 96 101 96 87  Temp: 99.1 F (37.3 C) 98.7 F (37.1 C) 98 F (36.7 C) 97.7 F (36.5 C)  TempSrc: Oral Oral Oral Oral  Resp: 18 18 18 22   Height:      Weight:      SpO2:        Physical Exam:  General: alert, cooperative, fatigued and no distress Lochia: appropriate Uterine Fundus: firm Incision: healing well DVT Evaluation: No evidence of DVT seen on physical exam.   Recent Labs  04/07/16 0601 04/07/16 1713  HGB 11.8* 10.5*  HCT 34.6* 30.7*    Assessment/Plan: Plan for discharge tomorrow and Breastfeeding  Encouraged her to take at least one Percocet for headache   LOS: 1 day   [facility],Estle Sabella 04/08/2016, 6:24 AM

## 2016-04-08 NOTE — Progress Notes (Signed)
UR chart review completed.  

## 2016-04-08 NOTE — Progress Notes (Signed)
Post Partum Day 1  Subjective:  Shannon Jarvis is a 30 y.o. [id number] 7677w1d s/p SVD.  No acute events overnight.  Pt denies problems with ambulating, voiding or po intake.  She denies nausea or vomiting.  Does endorse a headache, no scotoma, BPs within normal range. Pain is moderately controlled.  She has had flatus. She has not had bowel movement.  Lochia Small.  Plan for birth control is natural family planning (NFP).  Method of Feeding: breast  Objective: BP 120/63 mmHg  Pulse 87  Temp(Src) 97.7 F (36.5 C) (Oral)  Resp 22  Ht 5\' 5"  (1.651 m)  Wt 93.895 kg (207 lb)  BMI 34.45 kg/m2  SpO2 99%  LMP 07/15/2015 (Exact Date)  Breastfeeding? Unknown  Physical Exam:  General: alert, cooperative and no distress Lochia:normal flow Chest: CTAB Heart: RRR no m/r/g Abdomen: +BS, soft, nontender, fundus firm at/below umbilicus Uterine Fundus: firm, nontender DVT Evaluation: No evidence of DVT seen on physical exam. Extremities: no edema   Recent Labs  04/07/16 0601 04/07/16 1713  HGB 11.8* 10.5*  HCT 34.6* 30.7*    Assessment/Plan:  ASSESSMENT: Shannon Jarvis is a 30 y.o. [id number] 2177w1d ppd #1 s/p NSVD doing well.   Plan for discharge tomorrow and Breastfeeding   LOS: 1 day   Loni MuseKate Nilson Tabora 04/08/2016, 7:59 AM

## 2016-04-08 NOTE — Lactation Note (Signed)
This note was copied from a baby's chart. Lactation Consultation Note Initial visit at 26 hours of age.  Mom had requested no assistance while trying to rest earlier.  RN discussed LC services and mom agrees to visit.  Mom reports cramping and sciatic pain that is interfering with her caring for baby.  Mom is considering offer formula by syringe and maybe doing pumping but she is unsure. LC discussed benefits of pumping and mom will let us know if she needs assist. Mom requesting assist with syringe feeding baby formula already in room, but baby is asleep and not showing feeding cues.  Encouraged mom to rest when she can.  [facility] given and discussed.  Encouraged to feed with early cues on demand.  Early newborn behavior discussed.  Hand expression reported by mom with colostrum visible.  Mom to call for assist as needed.    Patient Name: Shannon Quintin AltoJessica Hellenbrand ZOXWR'UToday's Date: 04/08/2016 Reason for consult: Initial assessment   Maternal Data Has patient been taught Hand Expression?: Yes Does the patient have breastfeeding experience prior to this delivery?: Yes  Feeding    LATCH Score/Interventions                      Lactation Tools Discussed/Used     Consult Status Consult Status: Follow-up Date: 04/09/16 Follow-up type: In-patient    Jannifer RodneyShoptaw, Jana Lynn 04/08/2016, 10:15 PM

## 2016-04-08 NOTE — Progress Notes (Signed)
Pt refused lab draw for CBC this morning. Per phlebotomist, pt stated she didn't want it because she hasn't slept.

## 2016-04-08 NOTE — Progress Notes (Signed)
Pt c/o headache off and on throughout night, made better with tylenol and motrin. Per pt, ambulating helped as well. This AM, pt declines pain medication but rates headache 7/10. No S&S PIH, BPs 120s/60s. Pt does request "stronger" pain medication this morning around breakfast but not now. Will continue to closely monitor.

## 2016-04-08 NOTE — Anesthesia Postprocedure Evaluation (Signed)
Anesthesia Post Note  Patient: Shannon Jarvis  Procedure(s) Performed: * No procedures listed *  Patient location during evaluation: Mother Baby Anesthesia Type: Epidural Level of consciousness: oriented and awake and alert Pain management: pain level controlled Vital Signs Assessment: post-procedure vital signs reviewed and stable Respiratory status: spontaneous breathing and nonlabored ventilation Cardiovascular status: stable Postop Assessment: epidural receding, patient able to bend at knees, no signs of nausea or vomiting and adequate PO intake Anesthetic complications: no     Last Vitals:  Filed Vitals:   04/08/16 0044 04/08/16 0530  BP: 120/67 120/63  Pulse: 96 87  Temp: 36.7 C 36.5 C  Resp: 18 22    Last Pain:  Filed Vitals:   04/08/16 0738  PainSc: 3    Pain Goal: Patients Stated Pain Goal: 2 (04/08/16 0716)               Laban EmperorMalinova,Kassius Battiste Hristova

## 2016-04-09 ENCOUNTER — Ambulatory Visit: Payer: Self-pay

## 2016-04-09 MED ORDER — IBUPROFEN 600 MG PO TABS: 600.0000 mg | ORAL_TABLET | Freq: Four times a day (QID) | ORAL | Status: DC | PRN

## 2016-04-09 NOTE — Lactation Note (Signed)
This note was copied from a baby's chart. Lactation Consultation Note  Patient Name: Shannon Jarvis ZOXWR'UTodaShanda Bumpsy's Date: 04/09/2016 Reason for consult: Follow-up assessment;Other (Comment) (4 % weight loss , )  Per mom I'm going home, and even thought I breast fed this am probably will be switching to a bottle.  LC reviewed steps for prevention of engorgement if she decides to breast feed or bottle feed.  LC stressed the importance if breast are Full = a good sign , but if the breast start filling tight - breast are  Progressing to over full - engorgement and that condition can be very uncomfortable for a mom.  LC offered mom a hand pump for D/C and mom declined.  Mother informed of post-discharge support and given phone number to the lactation department, including services  for phone call assistance; out-patient appointments; and breastfeeding support group. List of other breastfeeding resources  in the community given in the handout. Encouraged mother to call for problems or concerns related to breastfeeding.Mother  informed of post-discharge support and given phone number to the lactation department, including services for phone call  assistance; out-patient appointments; and breastfeeding support group. List of other breastfeeding resources in the community  given in the handout. Encouraged mother to call for problems or concerns related to breastfeeding.   Maternal Data    Feeding Feeding Type: Breast Fed Length of feed: 30 min  LATCH Score/Interventions                Intervention(s): Breastfeeding basics reviewed     Lactation Tools Discussed/Used     Consult Status Consult Status: Complete Date: 04/09/16    Kathrin Greathouseorio, Akhilesh Sassone Ann 04/09/2016, 10:28 AM

## 2016-04-09 NOTE — Discharge Summary (Signed)
OB Discharge Summary     Patient Name: Shannon Jarvis DOB: [birth year] MRN: [id number]  Date of admission: 04/07/2016 Delivering MD: Garth BignessIMBERLAKE, KATHRYN   Date of discharge: 04/09/2016  Admitting diagnosis: 38WKS, CTX,BLEEDING Intrauterine pregnancy: 4632w1d     Secondary diagnosis:  Active Problems:   Pregnancy  Additional problems: none     Discharge diagnosis: Term Pregnancy Delivered                                                                                                Post partum procedures:none  Augmentation: AROM and Pitocin  Complications: None  Hospital course:  Onset of Labor With Vaginal Delivery     30 y.o. yo [id number] at 5132w1d was admitted in Active Labor on 04/07/2016. Patient had an uncomplicated labor course as follows:  Membrane Rupture Time/Date: 2:36 PM ,04/07/2016   Intrapartum Procedures: Episiotomy: None [1]                                         Lacerations:  1st degree [2]  Patient had a delivery of a Viable infant. 04/07/2016  Information for the patient's newborn:  Lupe Carneyage, Boy Dynastie [[id number]][[id number]]  Delivery Method: Vag-Spont    Pateint had an uncomplicated postpartum course.  She is ambulating, tolerating a regular diet, passing flatus, and urinating well. Endorses a mild headache, which she attributes to little sleep over the past few days. BPs WNL. Patient is discharged home in stable condition on 04/09/2016.    Physical exam  Filed Vitals:   04/08/16 0530 04/08/16 0845 04/08/16 1900 04/09/16 0523  BP: 120/63 120/66 128/80 132/78  Pulse: 87 95 90 76  Temp: 97.7 F (36.5 C) 98.1 F (36.7 C) 98.6 F (37 C) 98.1 F (36.7 C)  TempSrc: Oral Oral Oral Oral  Resp: 22 20 18 18   Height:      Weight:      SpO2:       General: alert, cooperative and no distress Lochia: appropriate Uterine Fundus: firm Incision: N/A DVT Evaluation: No evidence of DVT seen on physical exam. No significant calf/ankle edema. Labs: Lab Results  Component  Value Date   WBC 13.2* 04/07/2016   HGB 10.5* 04/07/2016   HCT 30.7* 04/07/2016   MCV 94.8 04/07/2016   PLT 115* 04/07/2016   CMP Latest Ref Rng 04/05/2016  Glucose 65 - 99 mg/dL 132(G173(H)  BUN 6 - 20 mg/dL 8  Creatinine 4.010.44 - 0.271.00 mg/dL 2.530.55  Sodium 664135 - 403145 mmol/L 135  Potassium 3.5 - 5.1 mmol/L 3.3(L)  Chloride 101 - 111 mmol/L 103  CO2 22 - 32 mmol/L 22  Calcium 8.9 - 10.3 mg/dL 4.7(Q8.6(L)  Total Protein 6.5 - 8.1 g/dL 6.1(L)  Total Bilirubin 0.3 - 1.2 mg/dL 0.3  Alkaline Phos 38 - 126 U/L 115  AST 15 - 41 U/L 15  ALT 14 - 54 U/L 11(L)    Discharge instruction: per After Visit Summary and "Baby and Me Booklet".  After visit meds:  Medication List    ASK your doctor about these medications        calcium carbonate 500 MG chewable tablet  Commonly known as:  TUMS - dosed in mg elemental calcium  Chew 2 tablets by mouth 3 (three) times daily as needed for heartburn.        Diet: routine diet  Activity: Advance as tolerated. Pelvic rest for 6 weeks.   Outpatient follow up:6 weeks Follow up Appt:No future appointments. Follow up Visit:No Follow-up on file.  Postpartum contraception: Natural Family Planning  Newborn Data: Live born female  Birth Weight: 7 lb 1.9 oz (3230 g) APGAR: 8, 9  Baby Feeding: Bottle and Breast Disposition:home with mother   04/09/2016 Loni Muse, MD   OB FELLOW DISCHARGE ATTESTATION  I have seen and examined this patient and agree with above documentation in the resident's note.   Silvano Bilis, MD 5:52 PM

## 2016-08-26 ENCOUNTER — Ambulatory Visit: Payer: Medicaid Other | Admitting: Family Medicine

## 2017-01-16 ENCOUNTER — Encounter: Payer: Self-pay | Admitting: Obstetrics & Gynecology

## 2017-01-16 ENCOUNTER — Ambulatory Visit (INDEPENDENT_AMBULATORY_CARE_PROVIDER_SITE_OTHER): Payer: BLUE CROSS/BLUE SHIELD | Admitting: Obstetrics & Gynecology

## 2017-01-16 VITALS — BP 139/79 | HR 78 | Resp 18 | Ht 65.0 in | Wt 188.0 lb

## 2017-01-16 DIAGNOSIS — Z0000 Encounter for general adult medical examination without abnormal findings: Secondary | ICD-10-CM

## 2017-01-16 DIAGNOSIS — Z1151 Encounter for screening for human papillomavirus (HPV): Secondary | ICD-10-CM | POA: Diagnosis not present

## 2017-01-16 DIAGNOSIS — F419 Anxiety disorder, unspecified: Secondary | ICD-10-CM

## 2017-01-16 DIAGNOSIS — Z124 Encounter for screening for malignant neoplasm of cervix: Secondary | ICD-10-CM | POA: Diagnosis not present

## 2017-01-16 DIAGNOSIS — Z01419 Encounter for gynecological examination (general) (routine) without abnormal findings: Secondary | ICD-10-CM | POA: Diagnosis not present

## 2017-01-16 NOTE — Progress Notes (Signed)
Subjective:    Shannon Jarvis is a 31 y.o. MW P2 (3 and 9 months kids)  female who presents for an annual exam. The patient has no complaints today.She says that she needs to see a psychiatrist, denies HI and SI.  The patient is sexually active. GYN screening history: last pap: was normal. The patient wears seatbelts: yes. The patient participates in regular exercise: no. Has the patient ever been transfused or tattooed?: yes. The patient reports that there is not domestic violence in her life.   Menstrual History: OB History    Gravida Para Term Preterm AB Living   SAB TAB Ectopic Multiple Live Births     1   0 2      Menarche age: 60 Patient's last menstrual period was 01/09/2017.    The following portions of the patient's history were reviewed and updated as appropriate: allergies, current medications, past family history, past medical history, past social history, past surgical history and problem list.  Review of Systems Pertinent items are noted in HPI.   FH- + breast cancer- mGM, mGreat aunt (Mother's side is Jewish) No gyn/colon/prostate/pancreas cancer Married for 4+ months, sometimes uncomfortable with sex Using family planning for contraception   Objective:    BP 139/79 (BP Location: Left Arm, Patient Position: Sitting, Cuff Size: Large)   Pulse 78   Resp 18   Ht  (1.651 m)   Wt 188 lb (85.3 kg)   LMP 01/09/2017   BMI 31.28 kg/m   General Appearance:    Alert, cooperative, no distress, appears stated age  Head:    Normocephalic, without obvious abnormality, atraumatic  Eyes:    PERRL, conjunctiva/corneas clear, EOM's intact, fundi    benign, both eyes  Ears:    Normal TM's and external ear canals, both ears  Nose:   Nares normal, septum midline, mucosa normal, no drainage    or sinus tenderness  Throat:   Lips, mucosa, and tongue normal; teeth and gums normal  Neck:   Supple, symmetrical, trachea midline, no adenopathy;    thyroid:  no  enlargement/tenderness/nodules; no carotid   bruit or JVD  Back:     Symmetric, no curvature, ROM normal, no CVA tenderness  Lungs:     Clear to auscultation bilaterally, respirations unlabored  Chest Wall:    No tenderness or deformity   Heart:    Regular rate and rhythm, S1 and S2 normal, no murmur, rub   or gallop  Breast Exam:    No tenderness, masses, or nipple abnormality  Abdomen:     Soft, non-tender, bowel sounds active all four quadrants,    no masses, no organomegaly  Genitalia:    Normal female without lesion, discharge or tenderness, 1st degree uterine prolapse, NSSA, NT, no adnexal masses or tenderness     Extremities:   Extremities normal, atraumatic, no cyanosis or edema  Pulses:   2+ and symmetric all extremities  Skin:   Skin color, texture, turgor normal, no rashes or lesions  Lymph nodes:   Cervical, supraclavicular, and axillary nodes normal  Neurologic:   CNII-XII intact, normal strength, sensation and reflexes    throughout  .    Assessment:    Healthy female exam.   Desire for psychiatry  Plan:     Rec My Risk   Refer to psychiatrist Refer to Fam med for general concerns Rec Kegels Pap with cotesting

## 2017-01-19 LAB — CYTOLOGY - PAP
DIAGNOSIS: NEGATIVE
HPV (WINDOPATH): NOT DETECTED

## 2017-02-04 ENCOUNTER — Telehealth: Payer: Self-pay | Admitting: Radiology

## 2017-02-04 ENCOUNTER — Encounter: Payer: Self-pay | Admitting: *Deleted

## 2017-02-04 NOTE — Telephone Encounter (Signed)
Received call from patient today to [facility], pt called cussing and yelling in the phone stating that she has try to contact our office 3 times without success. Spoke to Rn, CMA which everyone states that they have had no messages from this patient, nor have I. [facility] and is upset because noone has called her to schedule the appointment. She agrees that I did call her to give her information for her to call but she was leaving in vacation and did not want information or to schedule until she returned. I have printed out a list of Psychiatrist and have mailed it to her, she has been informed that she can choose from list and call for appointment

## 2017-03-24 ENCOUNTER — Telehealth: Payer: Self-pay | Admitting: *Deleted

## 2017-03-24 NOTE — Telephone Encounter (Signed)
Pt called in about price of BRCA and left a message for nurse to rtn call. I called pt back twice at [phone] and VM has not been set up. There was no answer and no way to leave a message.   Pt called back and I was able to speak to her about her EOB from [employer] who runs the BRCA testing. Pt states EOB has $3200 written beside of patient responsibility. Advised pt that the EOB is not a bill and is still being worked out between her ins co and [employer]. Pt states she has spoken to "some people in her group" and states she will in fact be responsible for that which is more than her deductible. Advised pt to call [phone], [employer] billing dept, and clarify with them her out of pocket amount, if she has to pay anything. Advised pt that according to family history, she qualifies for the testing and will possibly have no out of pocket expense. Advised pt to confirm that with [employer]. Pt states she will call them and see what she needs to do at this point.

## 2017-10-11 IMAGING — US US MFM OB COMP +14 WKS
1 series · 14 of 28 positions shown · non-contrast
Comparison: none

[Series 1: us mfm ob comp +14 wks · 77 acquisitions, 14 frames shown]
[im 3/77]
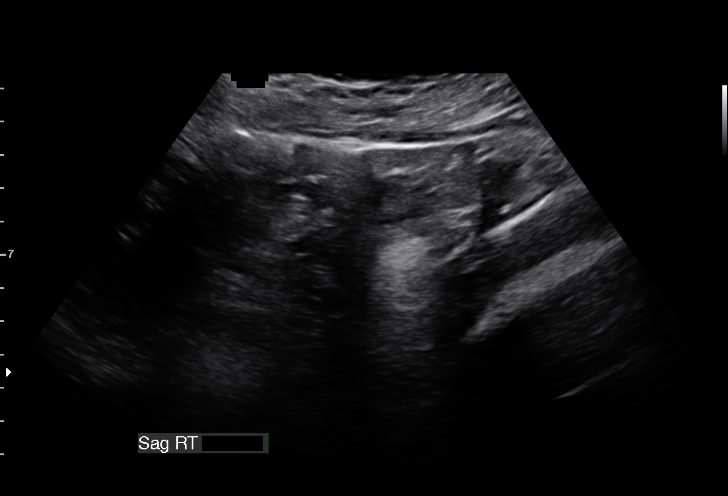
[im 9/77]
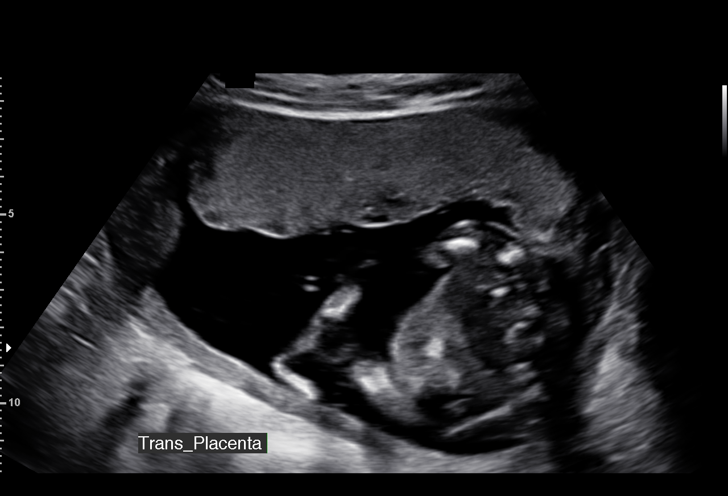
[im 15/77]
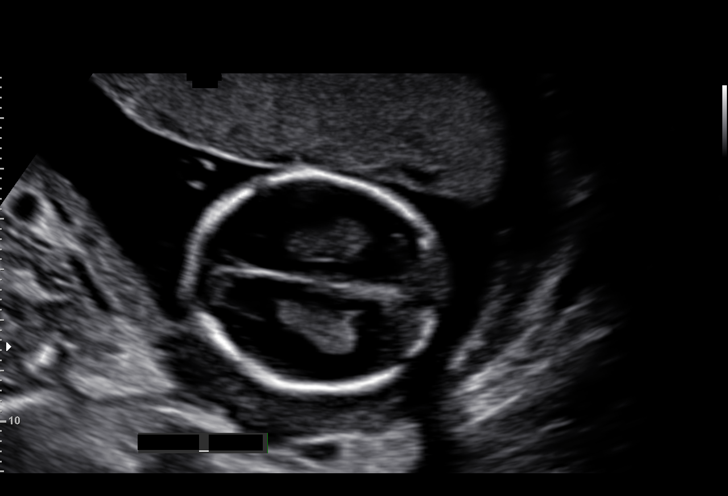
[im 20/77]
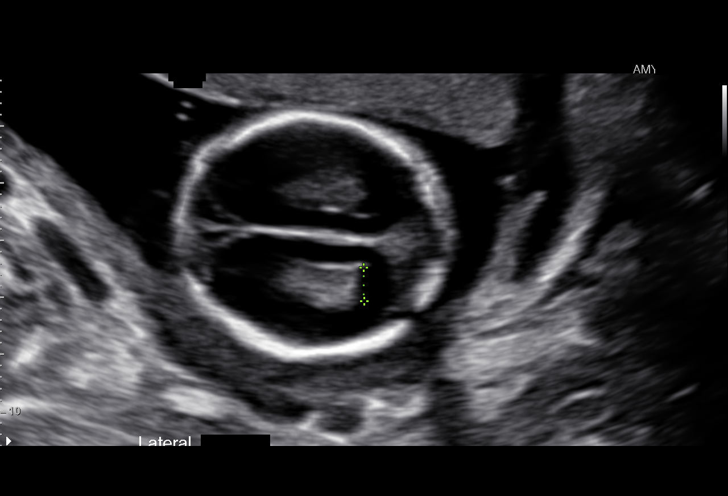
[im 26/77]
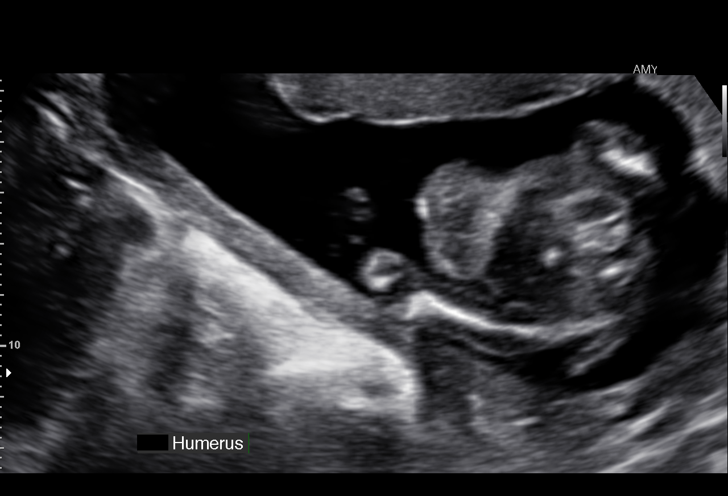
[im 31/77]
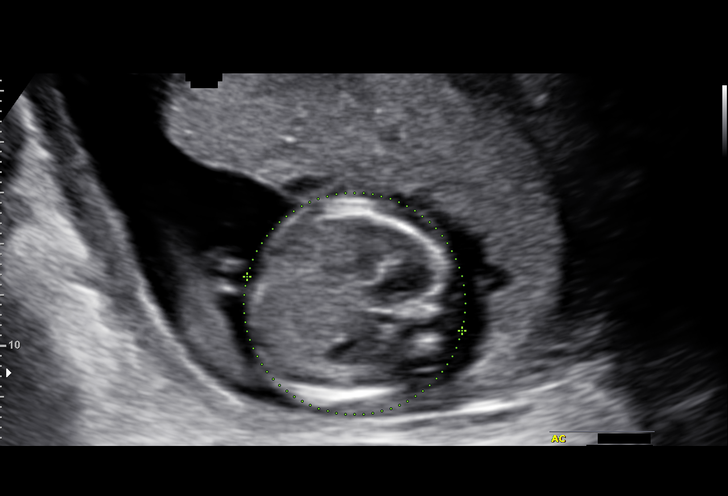
[im 37/77]
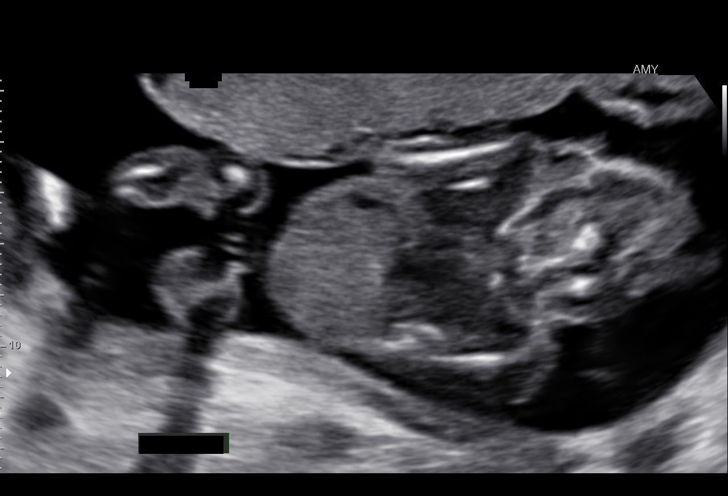
[im 43/77]
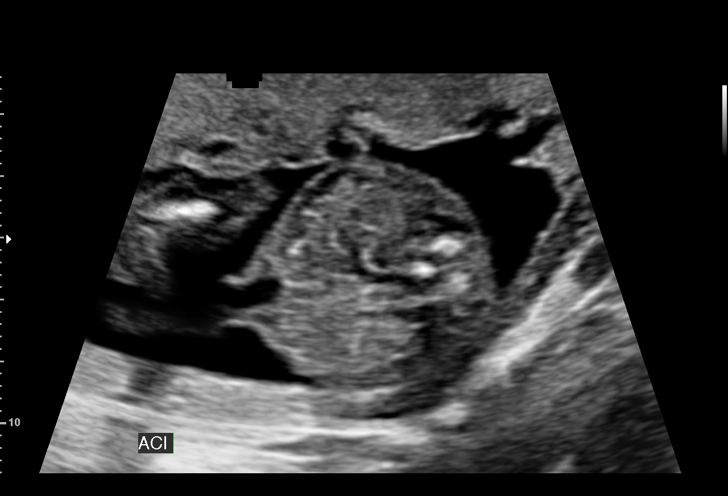
[im 48/77]
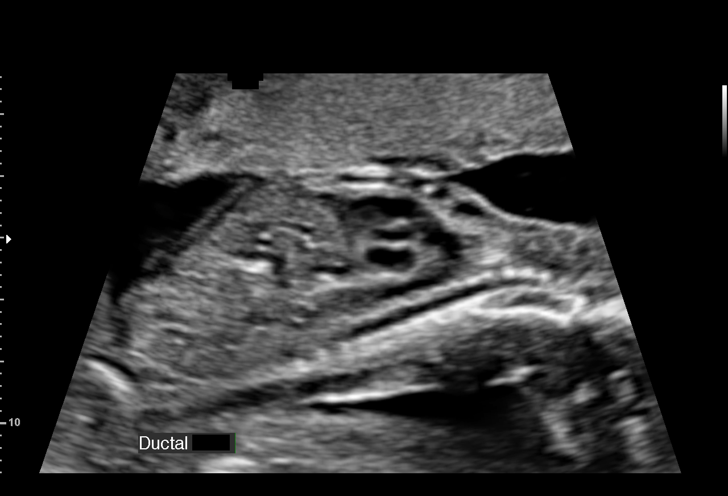
[im 54/77]
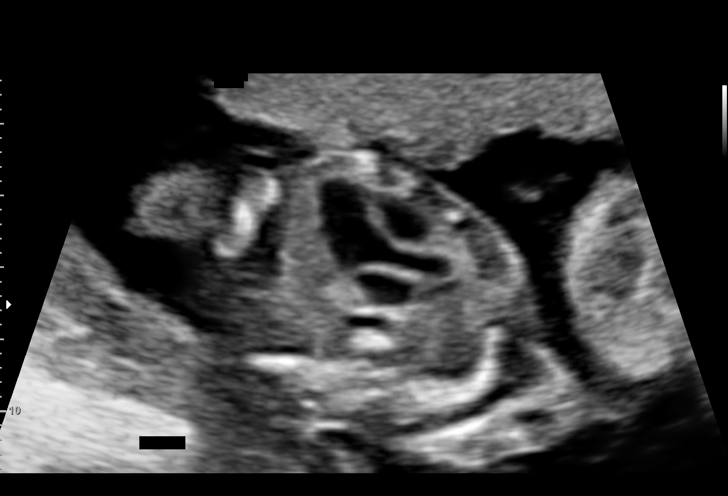
[im 60/77]
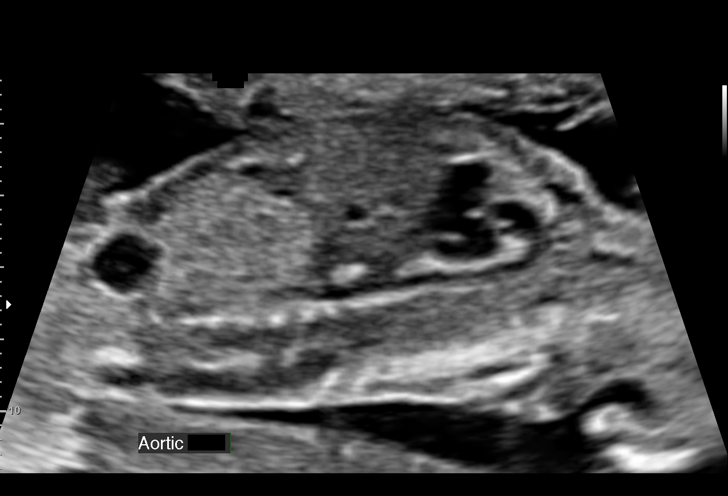
[im 65/77]
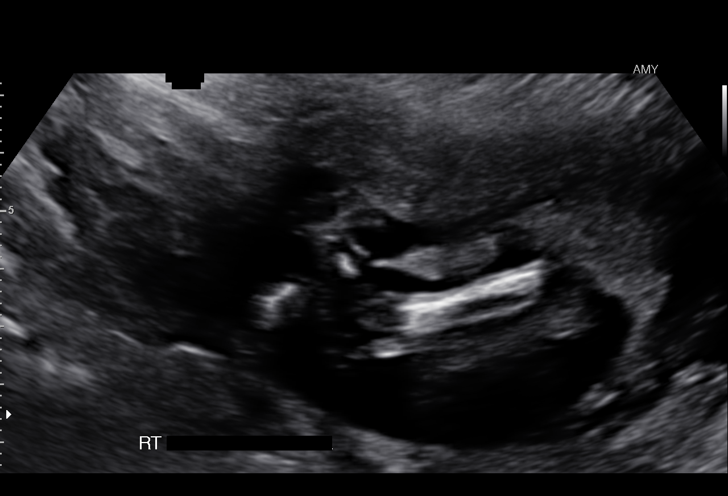
[im 71/77]
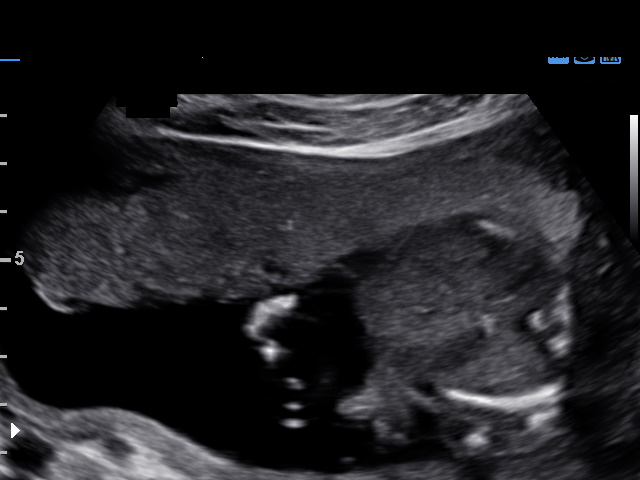
[im 77/77]
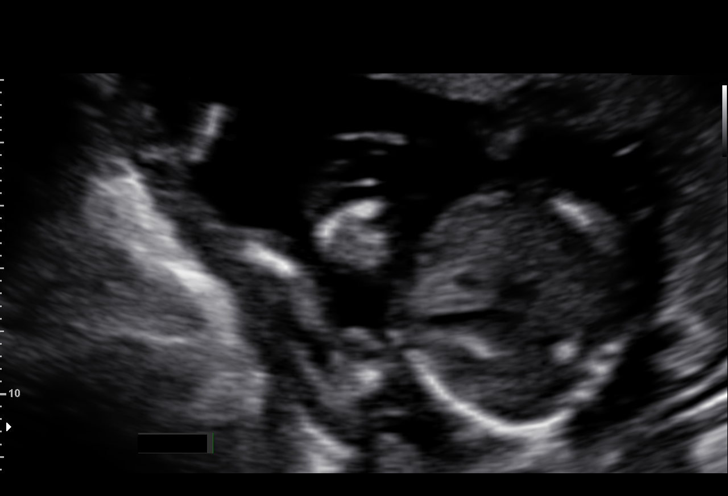

[14 of 28 positions shown; findings below may reference images not displayed]

1  EEGII GINO              [id number]      [id number]     [id number]
Indications

Poor obstetric history: Previous
preeclampsia / eclampsia/gestational HTN
Herpes simplex virus ([place])
Detailed fetal anatomic survey                 Z36
19 weeks gestation of pregnancy
OB History

Gravidity:    2         Term:   1        Prem:   0        SAB:   0
TOP:          0       Ectopic:  0        Living: 1
Fetal Evaluation

Num Of Fetuses:     1
Fetal Heart         140
Rate(bpm):
Cardiac Activity:   Observed
Presentation:       Cephalic
Placenta:           Anterior, above cervical os
P. Cord Insertion:  Visualized

Amniotic Fluid
AFI FV:      Subjectively within normal limits
Larg Pckt:     3.5  cm
Biometry

BPD:      43.3  mm     G. Age:  19w 1d                  CI:         67.7   %   70 - 86
FL/HC:      18.1   %   16.1 -
HC:      168.4  mm     G. Age:  19w 4d         46  %    HC/AC:      1.24       1.09 -
AC:      135.9  mm     G. Age:  19w 0d         33  %    FL/BPD:     70.4   %
FL:       30.5  mm     G. Age:  19w 3d         43  %    FL/AC:      22.4   %   20 - 24
HUM:      27.1  mm     G. Age:  18w 4d         30  %
CER:      19.1  mm     G. Age:  18w 4d         27  %
NFT:       4.6  mm
LV:        5.9  mm
CM:        2.7  mm

Est. FW:     282  gm    0 lb 10 oz      43  %
Gestational Age

LMP:           19w 3d       Date:   07/15/15                 EDD:   04/20/16
U/S Today:     19w 2d                                        EDD:   04/21/16
Best:          19w 3d    Det. By:   LMP  (07/15/15)          EDD:   04/20/16
Anatomy

Cranium:          Appears normal         Aortic Arch:      Appears normal
Fetal Cavum:      Appears normal         Ductal Arch:      Appears normal
Ventricles:       Appears normal         Diaphragm:        Appears normal
Choroid Plexus:   Appears normal         Stomach:          Appears normal, left
sided
Cerebellum:       Appears normal         Abdomen:          Appears normal
Posterior Fossa:  Appears normal         Abdominal Wall:   Appears nml (cord
insert, abd wall)
Nuchal Fold:      Appears normal         Cord Vessels:     Appears normal (3
vessel cord)
Face:             Appears normal         Kidneys:          Appear normal
(orbits and profile)
Lips:             Appears normal         Bladder:          Appears normal
Fetal Thoracic:   Appears normal         Spine:            Limited views
appear normal
Heart:            Appears normal         Upper             Appears normal
(4CH, axis, and        Extremities:
situs)
RVOT:             Appears normal         Lower             Appears normal
Extremities:
LVOT:             Appears normal

Other:  Fetus appears to be a male. Heels appear normal.
Cervix Uterus Adnexa

Cervix
Length:           3.09  cm.
Normal appearance by transabdominal scan. Appears closed, without
funnelling.

Left Ovary
Within normal limits.

Right Ovary
Not visualized. No adnexal mass visualized.
Impression

Single IUP at 19w 3d
Limited views of the fetal spine were obtained
The remainder of the fetal anatomy appears normal
Ultrasound measurements are consistent with dates
Anterior placenta without previa
Normal amniotic fluid volume
Recommendations

Recommend follow-up ultrasound examination in 4 weeks to
reevaluate the fetal spine

## 2017-11-14 IMAGING — US US MFM OB FOLLOW-UP
1 series · 14 of 28 positions shown · non-contrast
Comparison: none

[Series 1: us mfm ob follow-up · 64 acquisitions, 14 frames shown]
[im 3/64]
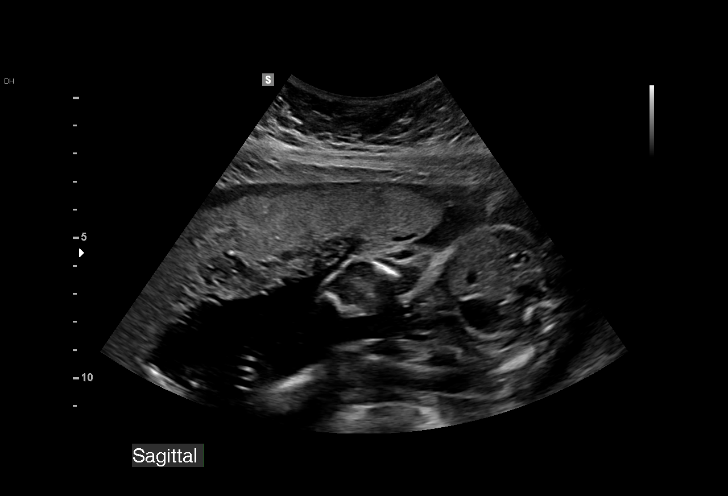
[im 8/64]
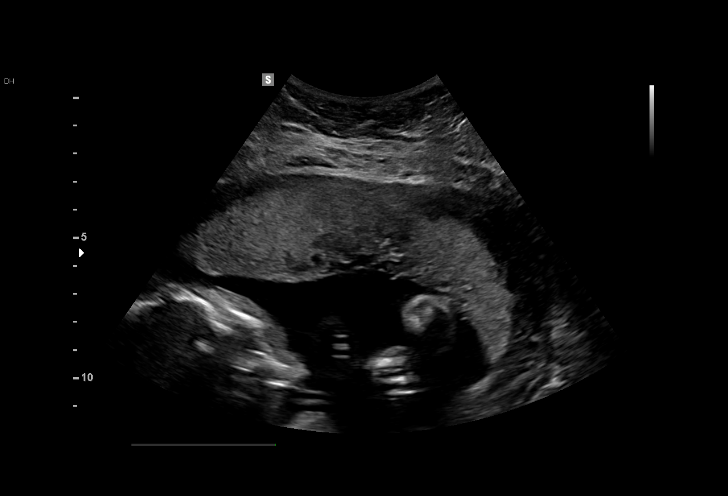
[im 12/64]
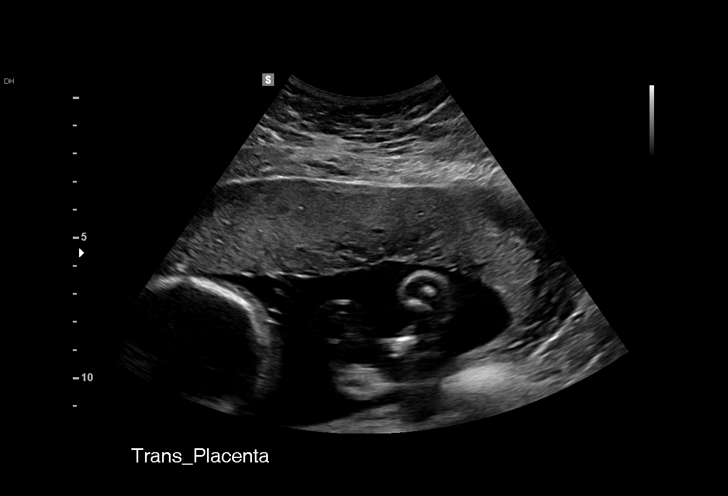
[im 17/64]
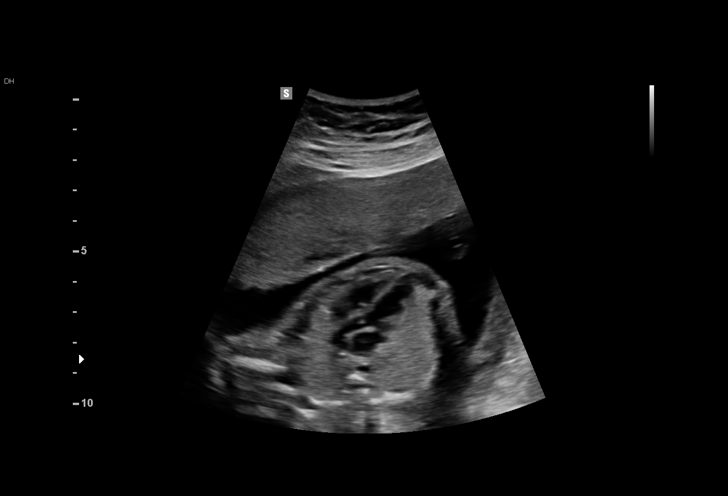
[im 22/64]
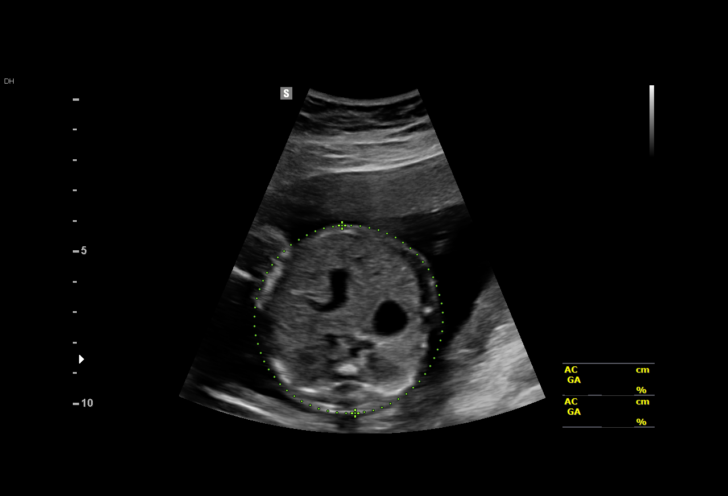
[im 26/64]
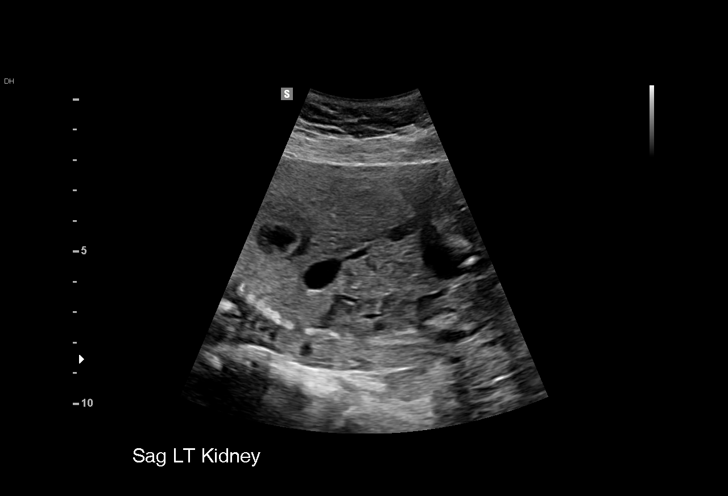
[im 31/64]
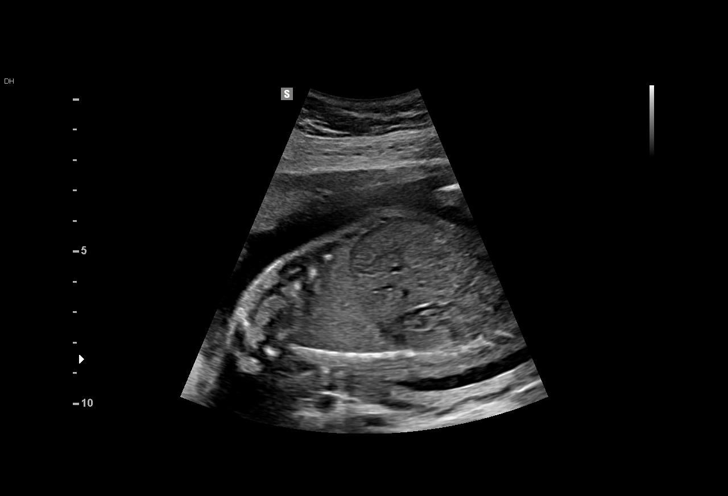
[im 36/64]
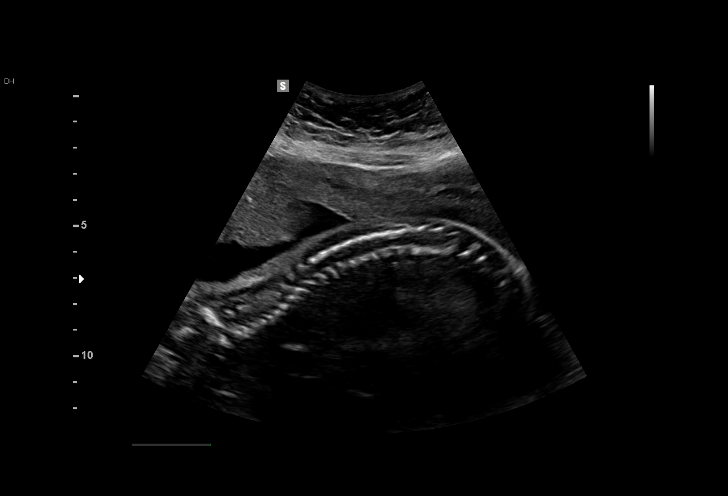
[im 40/64]
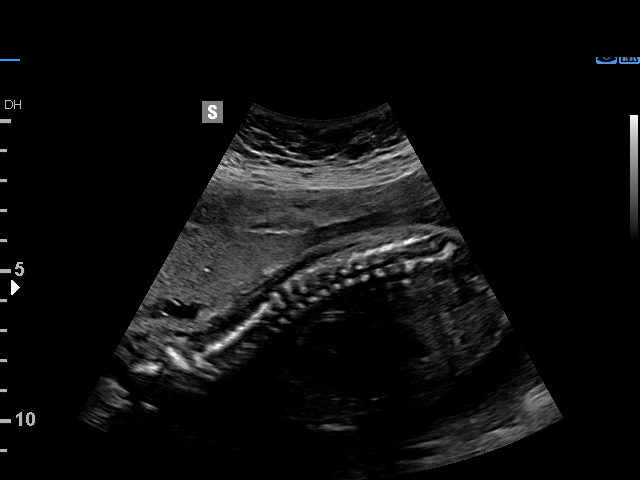
[im 45/64]
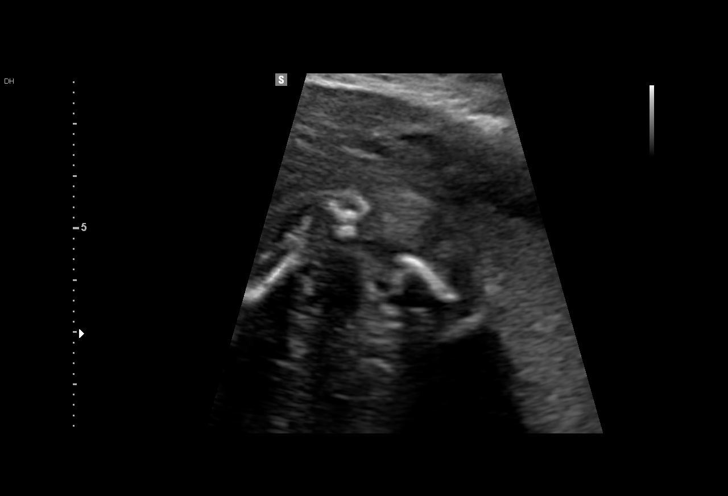
[im 50/64]
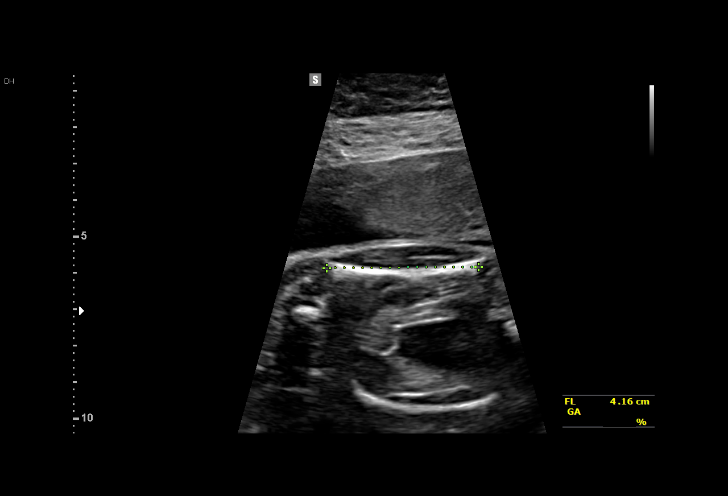
[im 54/64]
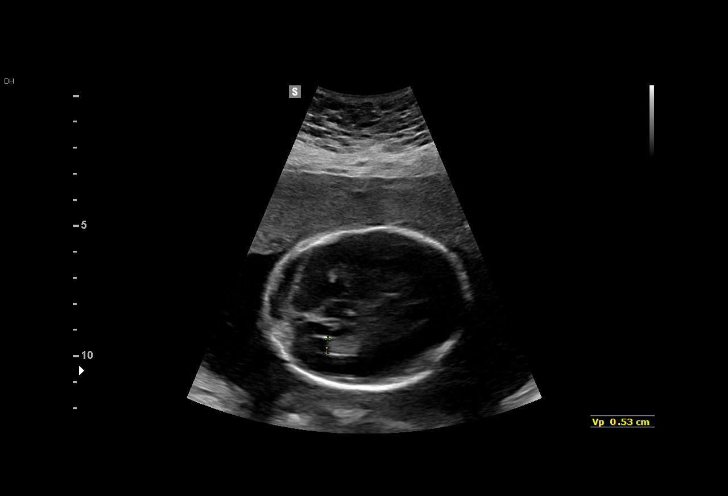
[im 59/64]
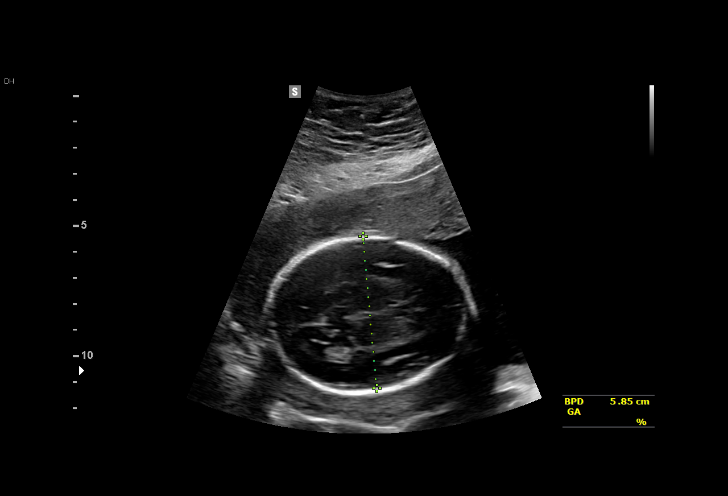
[im 64/64]
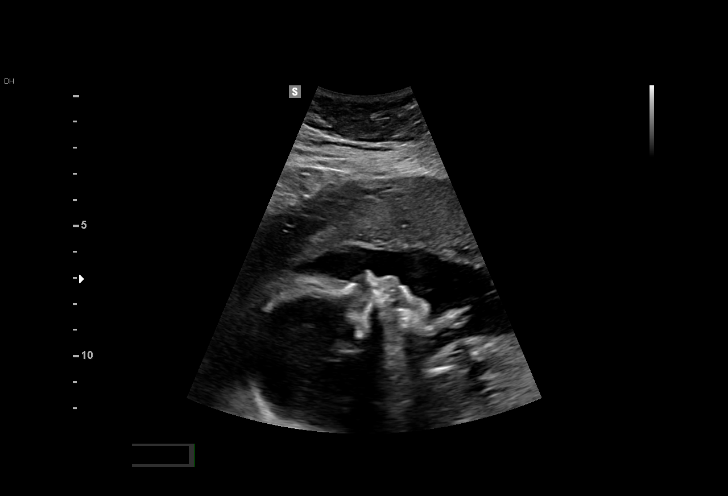

[14 of 28 positions shown; findings below may reference images not displayed]

[place]
Indications

Poor obstetric history: Previous
preeclampsia / eclampsia/gestational HTN
Herpes simplex virus ([place])
24 weeks gestation of pregnancy
Evaluate anatomy not seen on prior             Z36
sonogram
OB History

Gravidity:    2         Term:   1        Prem:   0        SAB:   0
TOP:          0       Ectopic:  0        Living: 1
Fetal Evaluation

Num Of Fetuses:     1
Fetal Heart         137
Rate(bpm):
Cardiac Activity:   Observed
Presentation:       Breech
Placenta:           Anterior, above cervical os
P. Cord Insertion:  Previously Visualized

Amniotic Fluid
AFI FV:      Subjectively within normal limits
Larg Pckt:     6.0  cm
Biometry

BPD:      57.9  mm     G. Age:  23w 5d                  CI:        70.36   %   70 - 86
FL/HC:      18.9   %   18.7 -
HC:      220.1  mm     G. Age:  24w 0d         23  %    HC/AC:      1.13       1.05 -
AC:      194.3  mm     G. Age:  24w 1d         37  %    FL/BPD:     71.7   %   71 - 87
FL:       41.5  mm     G. Age:  23w 4d         16  %    FL/AC:      21.4   %   20 - 24
Est. FW:     636  gm      1 lb 6 oz     43  %
Gestational Age

LMP:           24w 2d       Date:   07/15/15                 EDD:   04/20/16
U/S Today:     23w 6d                                        EDD:   04/23/16
Best:          24w 2d    Det. By:   LMP  (07/15/15)          EDD:   04/20/16
Anatomy

Cranium:          Appears normal         Aortic Arch:      Previously seen
Fetal Cavum:      Appears normal         Ductal Arch:      Previously seen
Ventricles:       Appears normal         Diaphragm:        Appears normal
Choroid Plexus:   Appears normal         Stomach:          Appears normal, left
sided
Cerebellum:       Appears normal         Abdomen:          Appears normal
Posterior Fossa:  Previously seen        Abdominal Wall:   Appears nml (cord
insert, abd wall)
Nuchal Fold:      Previously seen        Cord Vessels:     Previously seen
Face:             Orbits and profile     Kidneys:          Appear normal
previously seen
Lips:             Previously seen        Bladder:          Appears normal
Fetal Thoracic:   Appears normal         Spine:            Appears normal
Heart:            Appears normal         Upper             Previously seen
(4CH, axis, and        Extremities:
situs)
RVOT:             Previously seen        Lower             Previously seen
Extremities:
LVOT:             Previously seen

Other:  Previously fetus appears to be a male. Previously heels appear
normal.
Cervix Uterus Adnexa

Cervix
Length:            3.1  cm.
Normal appearance by transabdominal scan.
Impression

Single IUP at 24w 2d
Adequate views of the fetal spine were obtained without
defect, completing our survey
The remainder of the fetal anatomy likewise appears normal
Ultrasound measurements are consistent with appropriate
interval growth, 
mean for GA (43rd%)
Anterior placenta without previa
Normal amniotic fluid volume
Recommendations

Follow up as clinically indicated
Preeclamptic precautions given prior history.

## 2018-01-07 ENCOUNTER — Ambulatory Visit (INDEPENDENT_AMBULATORY_CARE_PROVIDER_SITE_OTHER): Payer: BLUE CROSS/BLUE SHIELD | Admitting: Podiatry

## 2018-01-07 ENCOUNTER — Ambulatory Visit (INDEPENDENT_AMBULATORY_CARE_PROVIDER_SITE_OTHER): Payer: BLUE CROSS/BLUE SHIELD

## 2018-01-07 DIAGNOSIS — M21611 Bunion of right foot: Secondary | ICD-10-CM | POA: Diagnosis not present

## 2018-01-07 DIAGNOSIS — M21612 Bunion of left foot: Secondary | ICD-10-CM

## 2018-01-07 DIAGNOSIS — M2012 Hallux valgus (acquired), left foot: Secondary | ICD-10-CM | POA: Diagnosis not present

## 2018-01-07 DIAGNOSIS — M21962 Unspecified acquired deformity of left lower leg: Secondary | ICD-10-CM | POA: Diagnosis not present

## 2018-01-07 MED ORDER — MELOXICAM 15 MG PO TABS
15.0000 mg | ORAL_TABLET | Freq: Every day | ORAL | 0 refills | Status: DC
Start: 2018-01-07 — End: 2018-02-03

## 2018-01-07 NOTE — Patient Instructions (Signed)
Pre-Operative Instructions  Congratulations, you have decided to take an important step towards improving your quality of life.  You can be assured that the doctors and staff at [facility] will be with you every step of the way.  Here are some important things you should know:  1. Plan to be at the surgery center/hospital at least 1 (one) hour prior to your scheduled time, unless otherwise directed by the surgical center/hospital staff.  You must have a responsible adult accompany you, remain during the surgery and drive you home.  Make sure you have directions to the surgical center/hospital to ensure you arrive on time. 2. If you are having surgery at [facility] or [facility] hospitals, you will need a copy of your medical history and physical form from your family physician within one month prior to the date of surgery. We will give you a form for your primary physician to complete.  3. We make every effort to accommodate the date you request for surgery.  However, there are times where surgery dates or times have to be moved.  We will contact you as soon as possible if a change in schedule is required.   4. No aspirin/ibuprofen for one week before surgery.  If you are on aspirin, any non-steroidal anti-inflammatory medications (Mobic, Aleve, Ibuprofen) should not be taken seven (7) days prior to your surgery.  You make take Tylenol for pain prior to surgery.  5. Medications - If you are taking daily heart and blood pressure medications, seizure, reflux, allergy, asthma, anxiety, pain or diabetes medications, make sure you notify the surgery center/hospital before the day of surgery so they can tell you which medications you should take or avoid the day of surgery. 6. No food or drink after midnight the night before surgery unless directed otherwise by surgical center/hospital staff. 7. No alcoholic beverages 24-hours prior to surgery.  No smoking 24-hours prior or 24-hours after  surgery. 8. Wear loose pants or shorts. They should be loose enough to fit over bandages, boots, and casts. 9. Don't wear slip-on shoes. Sneakers are preferred. 10. Bring your boot with you to the surgery center/hospital.  Also bring crutches or a walker if your physician has prescribed it for you.  If you do not have this equipment, it will be provided for you after surgery. 11. If you have not been contacted by the surgery center/hospital by the day before your surgery, call to confirm the date and time of your surgery. 12. Leave-time from work may vary depending on the type of surgery you have.  Appropriate arrangements should be made prior to surgery with your employer. 13. Prescriptions will be provided immediately following surgery by your doctor.  Fill these as soon as possible after surgery and take the medication as directed. Pain medications will not be refilled on weekends and must be approved by the doctor. 14. Remove nail polish on the operative foot and avoid getting pedicures prior to surgery. 15. Wash the night before surgery.  The night before surgery wash the foot and leg well with water and the antibacterial soap provided. Be sure to pay special attention to beneath the toenails and in between the toes.  Wash for at least three (3) minutes. Rinse thoroughly with water and dry well with a towel.  Perform this wash unless told not to do so by your physician.  Enclosed: 1 Ice pack (please put in freezer the night before surgery)   1 Hibiclens skin cleaner     Pre-op instructions  If you have any questions regarding the instructions, please do not hesitate to call our office.  [place]: [address], [place], [place] [address] -- [phone]  [place]: [address] [place], [place] [address] -- [phone]  [place]: [address]  [place], [place] [address] -- [phone]  [place]: [address], [address], [place], [place] [address] -- [phone]  Website:  [email or web address] 

## 2018-01-26 NOTE — Progress Notes (Signed)
  Subjective:  Patient ID: Shannon Jarvis, female    DOB: [birth year],  MRN: [id number]  Chief Complaint  Patient presents with  . Bunions    bilateral, left more painful than right  . Hammer Toe   32 y.o. female presents with the above complaint.  Reports pain in both feet due to bunion deformity.  States the left is worse than the right.  States she cannot go barefoot at all.  States is very painful would like to discuss possible surgical correction.  Has been holding off on this for several years.  Past Medical History:  Diagnosis Date  . Genital herpes   . Pregnancy induced hypertension    Past Surgical History:  Procedure Laterality Date  . TONSILLECTOMY    . WISDOM TOOTH EXTRACTION  07/10/2015    Current Outpatient Medications:  .  acyclovir (ZOVIRAX) 400 MG tablet, Take 400 mg by mouth as needed., Disp: , Rfl:  .  meloxicam (MOBIC) 15 MG tablet, Take 1 tablet (15 mg total) by mouth daily., Disp: 30 tablet, Rfl: 0 .  ranitidine (ZANTAC) 150 MG capsule, Take 150 mg by mouth 2 (two) times daily., Disp: , Rfl:   Allergies  Allergen Reactions  . Augmentin [Amoxicillin-Pot Clavulanate] Hives and Other (See Comments)    Joint swelling Has patient had a PCN reaction causing immediate rash, facial/tongue/throat swelling, SOB or lightheadedness with hypotension: Yes Has patient had a PCN reaction causing severe rash involving mucus membranes or skin necrosis: Yes Has patient had a PCN reaction that required hospitalization Yes Has patient had a PCN reaction occurring within the last 10 years: No If all of the above answers are "NO", then may proceed with Cephalosporin use.    Review of Systems: Negative except as noted in the HPI. Denies N/V/F/Ch. Objective:  There were no vitals filed for this visit. General AA&O x3. Normal mood and affect.  Vascular Dorsalis pedis and posterior tibial pulses  present 2+ bilaterally  Capillary refill normal to all digits. Pedal hair growth  normal.  Neurologic Epicritic sensation grossly present.  Dermatologic No open lesions. Interspaces clear of maceration. Nails well groomed and normal in appearance.  Orthopedic: MMT 5/5 in dorsiflexion, plantarflexion, inversion, and eversion. 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  Pain palpation about the medial eminence bilateral   Assessment & Plan:  Patient was evaluated and treated and all questions answered.  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  HAV deformity with evidence of second metatarsal overload/first metatarsal likely hypermobility -Discussed conservative versus surgical therapy with patient.  Surgical therapy would likely include Lapidus bunionectomy discussed extended nonweightbearing due to this procedure.  Would plan for intramedullary rod as patient has had issues with palpable hardware.  Patient would like to hold off until January for surgery. -We will further review in 6 months -In the meantime discussed importance of proper shoe gear  Return in about 6 months (around 07/09/2018) for Bunion f/u.

## 2018-02-03 ENCOUNTER — Other Ambulatory Visit: Payer: Self-pay | Admitting: Podiatry

## 2018-02-08 ENCOUNTER — Other Ambulatory Visit: Payer: Self-pay

## 2018-02-08 ENCOUNTER — Ambulatory Visit: Payer: BLUE CROSS/BLUE SHIELD | Admitting: Podiatry

## 2018-02-08 DIAGNOSIS — Z889 Allergy status to unspecified drugs, medicaments and biological substances status: Secondary | ICD-10-CM | POA: Insufficient documentation

## 2018-02-08 DIAGNOSIS — O1490 Unspecified pre-eclampsia, unspecified trimester: Secondary | ICD-10-CM | POA: Insufficient documentation

## 2018-02-17 IMAGING — US US MFM FETAL BPP WO NON STRESS
1 series · 15 of 15 positions shown · non-contrast
Comparison: none

[Series 1: us mfm fetal bpp wo non stress · 15 acquisitions, 15 frames shown]
[im 1/15]
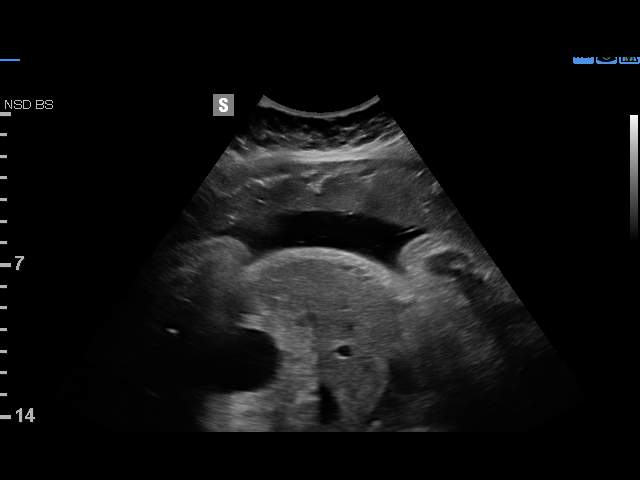
[im 2/15]
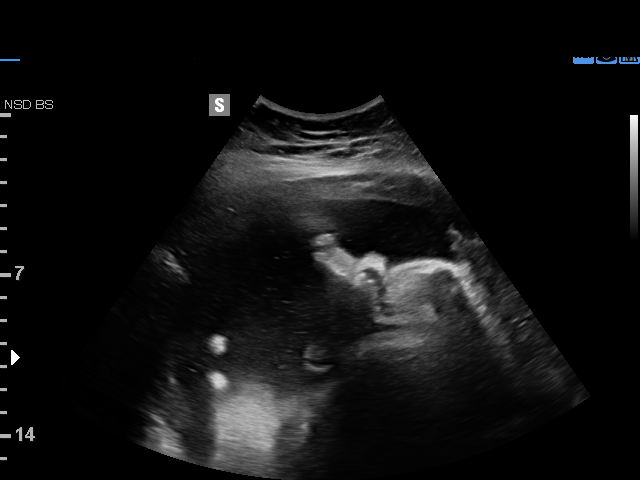
[im 3/15]
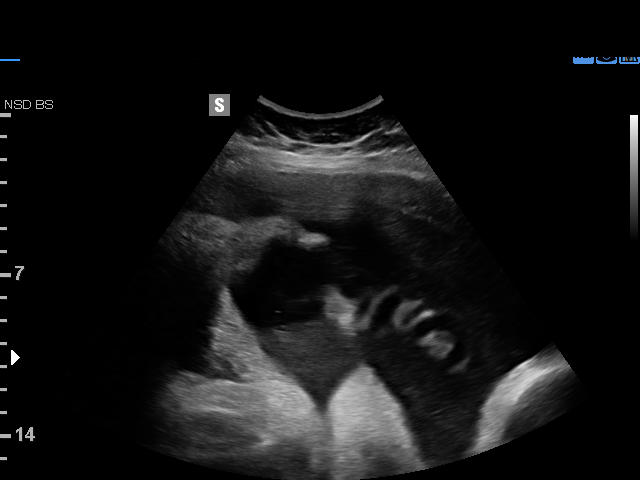
[im 4/15]
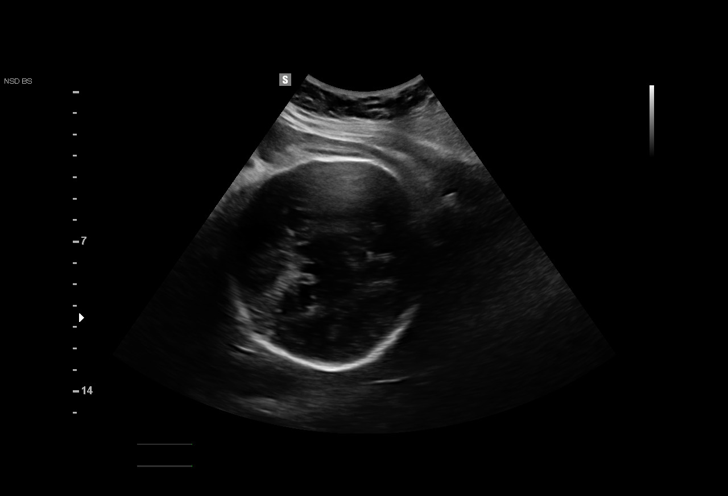
[im 5/15]
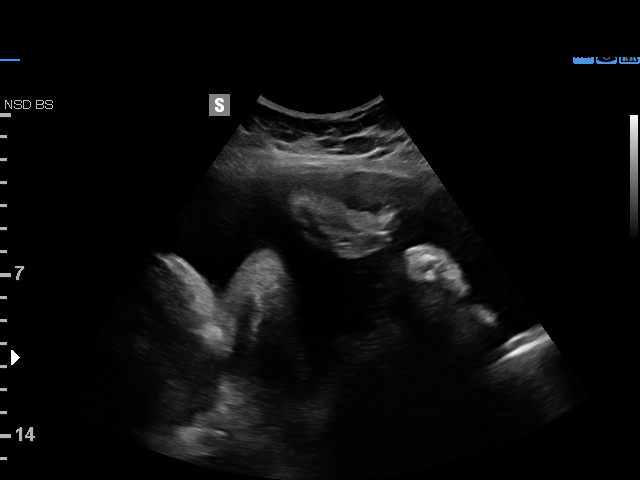
[im 6/15]
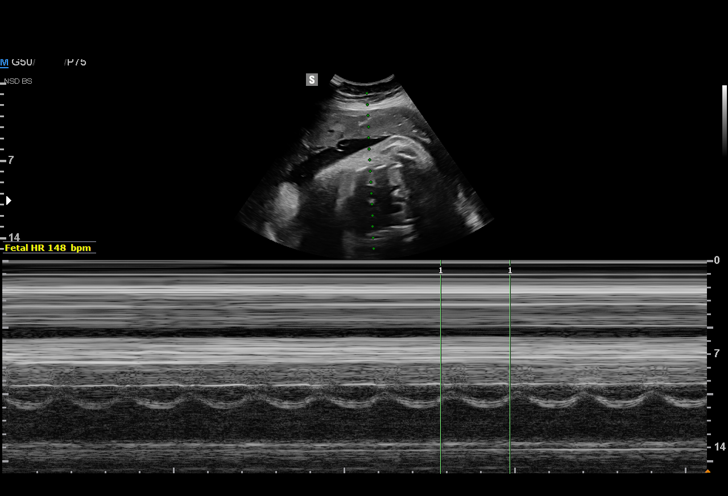
[im 7/15]
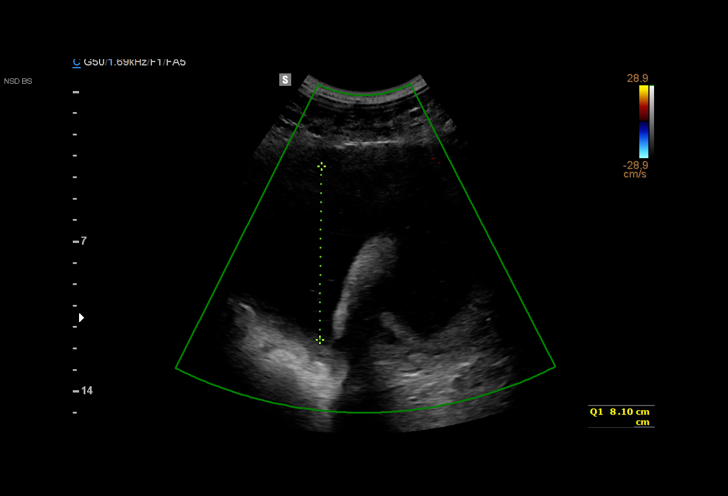
[im 8/15]
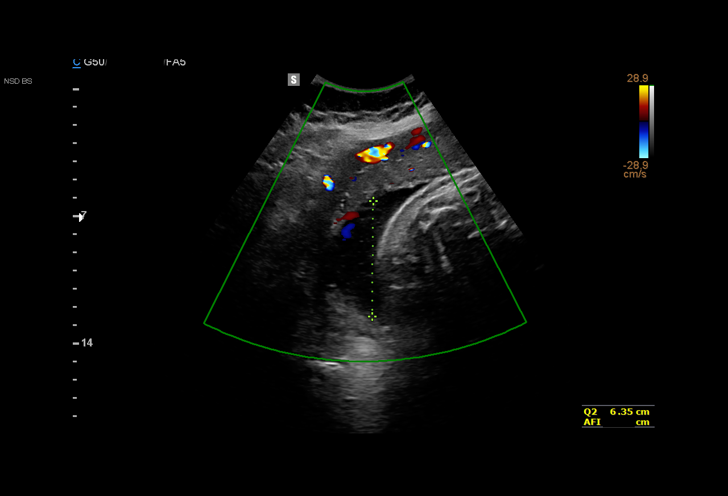
[im 9/15]
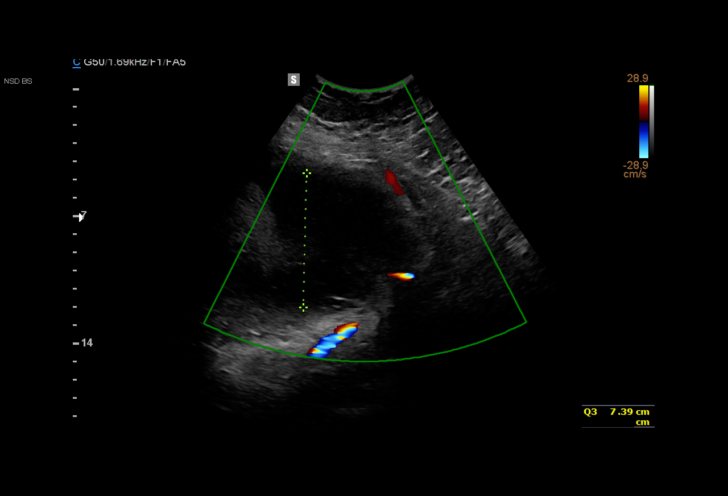
[im 10/15]
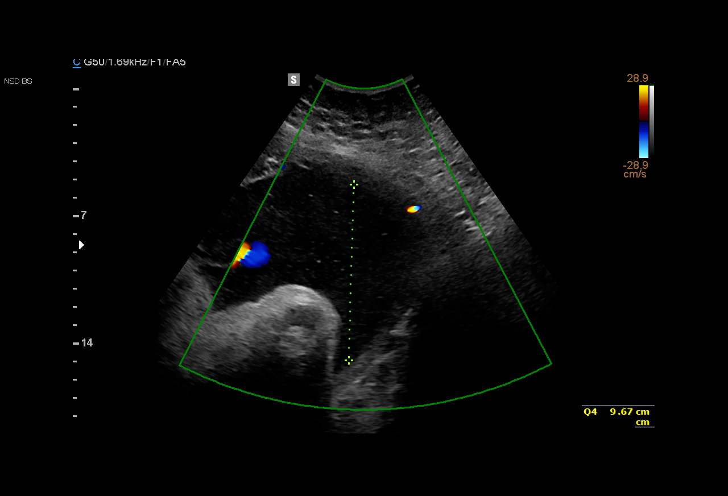
[im 11/15]
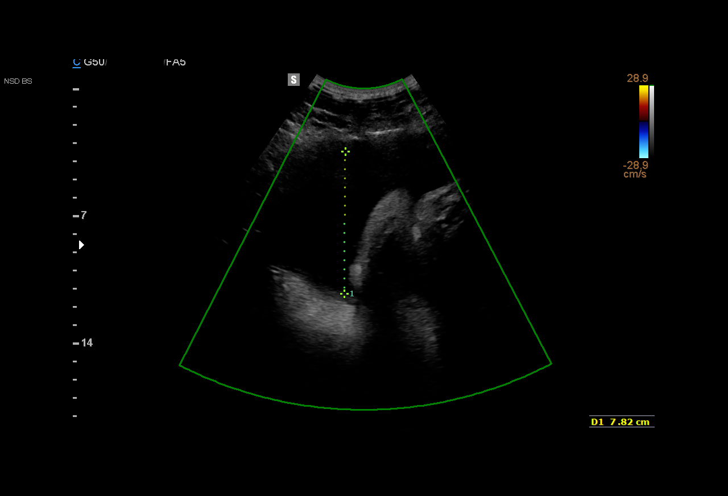
[im 12/15]
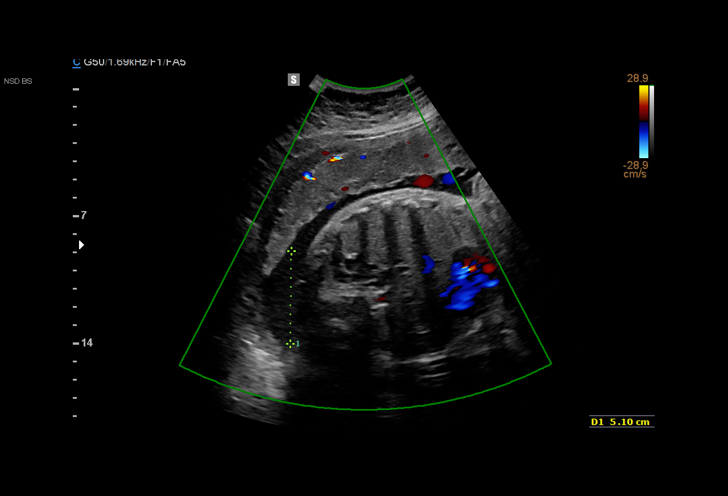
[im 13/15]
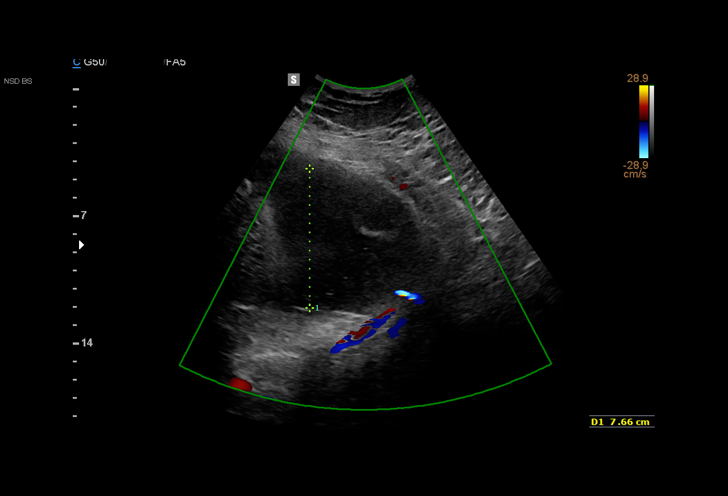
[im 14/15]
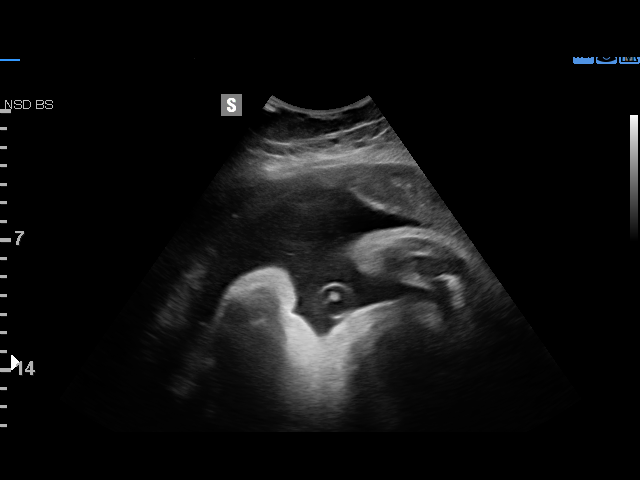
[im 15/15]
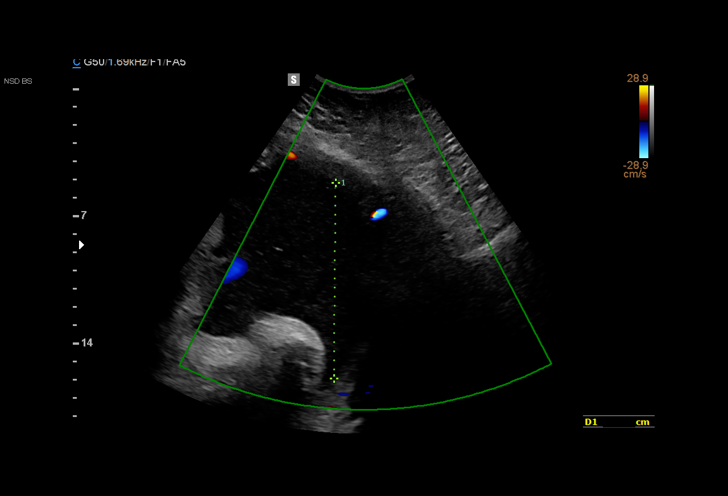

[15 of 15 positions shown; findings below may reference images not displayed]

Attending:        Quyen Paudel        Secondary Phy.:   [facility] Nursing-
MAU/Triage

1  STAARJITHKIA GYONGYIKE           [id number]      [id number]     [id number]
Indications

37 weeks gestation of pregnancy
Non-reactive NST, FHR decelerations
(Variables)
OB History

Gravidity:    2         Term:   1        Prem:   0        SAB:   0
TOP:          0       Ectopic:  0        Living: 1
Fetal Evaluation

Num Of Fetuses:     1
Fetal Heart         148
Rate(bpm):
Cardiac Activity:   Observed
Presentation:       Cephalic

Amniotic Fluid
AFI FV:      Polyhydramnios

AFI Sum(cm)     %Tile       Largest Pocket(cm)
31.51           > 97

RUQ(cm)       RLQ(cm)       LUQ(cm)        LLQ(cm)
8.1
Biophysical Evaluation

Amniotic F.V:   Pocket => 2 cm two         F. Tone:        Observed
planes
F. Movement:    Observed                   Score:          [DATE]
F. Breathing:   Observed
Gestational Age

LMP:           37w 6d       Date:   07/15/15                 EDD:   04/20/16
Best:          37w 6d    Det. By:   LMP  (07/15/15)          EDD:   04/20/16
Impression

Single IUP at 37w 6d
Cephalic presentation
BPP [DATE]
Polyhydramnios noted (AFI 31.51 cm)
Recommendations

Recommend antenatal testing (2x weekly NSTs with weekly
AFIs)
Delivery at 39 weeks in the absence of other complications if
polyhydramnios persists

## 2018-04-12 ENCOUNTER — Ambulatory Visit: Payer: BLUE CROSS/BLUE SHIELD | Admitting: Podiatry

## 2018-06-26 ENCOUNTER — Ambulatory Visit (HOSPITAL_COMMUNITY)
Admission: RE | Admit: 2018-06-26 | Discharge: 2018-06-26 | Disposition: A | Discharge status: Home / Self Care | Payer: BLUE CROSS/BLUE SHIELD | Attending: Psychiatry | Admitting: Psychiatry

## 2018-06-26 NOTE — Significant Event (Cosign Needed)
Shannon Jarvis is an 32 y.o. female. Presented to [facility] as a walk-in, visibly intoxicated,deferring an MSE, while acknowledging the R & B associated with not garnering the offered medical /psychiatric evaluation. Patient left after calling her husband who picked her up. She desired to follow up with (the Clinical research associate of this note) At [facility] for substance abuse treatment/counseling, and psychiatric mgmt.

## 2018-07-15 ENCOUNTER — Ambulatory Visit: Payer: BLUE CROSS/BLUE SHIELD | Admitting: Podiatry

## 2018-10-05 ENCOUNTER — Other Ambulatory Visit: Payer: Self-pay | Admitting: Obstetrics and Gynecology

## 2018-10-05 DIAGNOSIS — N644 Mastodynia: Secondary | ICD-10-CM

## 2018-10-21 ENCOUNTER — Other Ambulatory Visit: Payer: Self-pay

## 2018-12-15 ENCOUNTER — Other Ambulatory Visit: Payer: Self-pay

## 2020-03-29 ENCOUNTER — Ambulatory Visit: Payer: BLUE CROSS/BLUE SHIELD | Admitting: Podiatry

## 2020-04-16 ENCOUNTER — Encounter: Payer: Self-pay | Admitting: Podiatry

## 2020-04-16 ENCOUNTER — Ambulatory Visit (INDEPENDENT_AMBULATORY_CARE_PROVIDER_SITE_OTHER): Payer: BC Managed Care – PPO

## 2020-04-16 ENCOUNTER — Other Ambulatory Visit: Payer: Self-pay

## 2020-04-16 ENCOUNTER — Ambulatory Visit (INDEPENDENT_AMBULATORY_CARE_PROVIDER_SITE_OTHER): Payer: BC Managed Care – PPO | Admitting: Podiatry

## 2020-04-16 DIAGNOSIS — M21611 Bunion of right foot: Secondary | ICD-10-CM

## 2020-04-16 DIAGNOSIS — M21612 Bunion of left foot: Secondary | ICD-10-CM

## 2020-04-16 DIAGNOSIS — M779 Enthesopathy, unspecified: Secondary | ICD-10-CM

## 2020-04-16 DIAGNOSIS — M2012 Hallux valgus (acquired), left foot: Secondary | ICD-10-CM

## 2020-04-16 DIAGNOSIS — M2041 Other hammer toe(s) (acquired), right foot: Secondary | ICD-10-CM

## 2020-04-16 DIAGNOSIS — M2011 Hallux valgus (acquired), right foot: Secondary | ICD-10-CM

## 2020-04-16 DIAGNOSIS — M2042 Other hammer toe(s) (acquired), left foot: Secondary | ICD-10-CM

## 2020-04-16 DIAGNOSIS — M778 Other enthesopathies, not elsewhere classified: Secondary | ICD-10-CM

## 2020-04-16 NOTE — Patient Instructions (Signed)
Bunion  A bunion is a bump on the base of the big toe that forms when the bones of the big toe joint move out of position. Bunions may be small at first, but they often get larger over time. They can make walking painful. What are the causes? A bunion may be caused by:  Wearing narrow or pointed shoes that force the big toe to press against the other toes.  Abnormal foot development that causes the foot to roll inward (pronate).  Changes in the foot that are caused by certain diseases, such as rheumatoid arthritis or polio.  A foot injury. What increases the risk? The following factors may make you more likely to develop this condition:  Wearing shoes that squeeze the toes together.  Having certain diseases, such as: ? Rheumatoid arthritis. ? Polio. ? Cerebral palsy.  Having family members who have bunions.  Being born with a foot deformity, such as flat feet or low arches.  Doing activities that put a lot of pressure on the feet, such as ballet dancing. What are the signs or symptoms? The main symptom of a bunion is a noticeable bump on the big toe. Other symptoms may include:  Pain.  Swelling around the big toe.  Redness and inflammation.  Thick or hardened skin on the big toe or between the toes.  Stiffness or loss of motion in the big toe.  Trouble with walking. How is this diagnosed? A bunion may be diagnosed based on your symptoms, medical history, and activities. You may have tests, such as:  X-rays. These allow your health care provider to check the position of the bones in your foot and look for damage to your joint. They also help your health care provider determine the severity of your bunion and the best way to treat it.  Joint aspiration. In this test, a sample of fluid is removed from the toe joint. This test may be done if you are in a lot of pain. It helps rule out diseases that cause painful swelling of the joints, such as arthritis. How is this  treated? Treatment depends on the severity of your symptoms. The goal of treatment is to relieve symptoms and prevent the bunion from getting worse. Your health care provider may recommend:  Wearing shoes that have a wide toe box.  Using bunion pads to cushion the affected area.  Taping your toes together to keep them in a normal position.  Placing a device inside your shoe (orthotics) to help reduce pressure on your toe joint.  Taking medicine to ease pain, inflammation, and swelling.  Applying heat or ice to the affected area.  Doing stretching exercises.  Surgery to remove scar tissue and move the toes back into their normal position. This treatment is rare. Follow these instructions at home: Managing pain, stiffness, and swelling   If directed, put ice on the painful area: ? Put ice in a plastic bag. ? Place a towel between your skin and the bag. ? Leave the ice on for 20 minutes, 2-3 times a day. Activity   If directed, apply heat to the affected area before you exercise. Use the heat source that your health care provider recommends, such as a moist heat pack or a heating pad. ? Place a towel between your skin and the heat source. ? Leave the heat on for 20-30 minutes. ? Remove the heat if your skin turns bright red. This is especially important if you are unable to feel pain,   heat, or cold. You may have a greater risk of getting burned.  Do exercises as told by your health care provider. General instructions  Support your toe joint with proper footwear, shoe padding, or taping as told by your health care provider.  Take over-the-counter and prescription medicines only as told by your health care provider.  Keep all follow-up visits as told by your health care provider. This is important. Contact a health care provider if your symptoms:  Get worse.  Do not improve in 2 weeks. Get help right away if you have:  Severe pain and trouble with walking. Summary  A  bunion is a bump on the base of the big toe that forms when the bones of the big toe joint move out of position.  Bunions can make walking painful.  Treatment depends on the severity of your symptoms.  Support your toe joint with proper footwear, shoe padding, or taping as told by your health care provider. This information is not intended to replace advice given to you by your health care provider. Make sure you discuss any questions you have with your health care provider. Document Revised: 03/22/2018 Document Reviewed: 01/26/2018 Elsevier Patient Education  2020 [employer] Inc[employer]  

## 2020-04-18 NOTE — Progress Notes (Signed)
Subjective:   Patient ID: Shannon Jarvis, female   DOB: 34 y.o.   MRN: [id number]   HPI Patient presents stating she knows that she is get any bunion surgery left and she is interested in me doing this and also she needs some kind of inserts as it seems that they help her as far as the pain she is experiencing.  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  Educated her all this and at this point we are going to do surgery but she cannot do it yet due to her schedule and her children.  I did cast for functional orthotics to try to reduce some of the pressure on her feet and ped Ortho status and she will be seen back when ready  X-rays indicate there is severe elevation of the intermetatarsal angle left moderate on the right with moderate metatarsus adductus deformity bilateral

## 2020-04-19 ENCOUNTER — Other Ambulatory Visit: Payer: Self-pay | Admitting: Podiatry

## 2020-04-19 DIAGNOSIS — M2012 Hallux valgus (acquired), left foot: Secondary | ICD-10-CM

## 2020-04-19 DIAGNOSIS — M2011 Hallux valgus (acquired), right foot: Secondary | ICD-10-CM

## 2020-05-07 ENCOUNTER — Other Ambulatory Visit: Payer: BC Managed Care – PPO | Admitting: Orthotics

## 2020-05-15 ENCOUNTER — Other Ambulatory Visit: Payer: BC Managed Care – PPO | Admitting: Orthotics

## 2020-05-16 ENCOUNTER — Telehealth: Payer: Self-pay | Admitting: Podiatry

## 2020-05-16 NOTE — Telephone Encounter (Signed)
Whatever works best

## 2020-05-16 NOTE — Telephone Encounter (Signed)
Give her a refund

## 2020-05-16 NOTE — Telephone Encounter (Signed)
Pt called and upset about the orthotics do not fit at all. She stated she is not comfortable with Rick at the original appt. She said her toes are tingling when she puts the orthotics in her shoes and they do not fit well in her shoes. She stated there is no arch support and she continuously told Raiford Noble and you that she needed arch support when he scanned her foot for them. She canceled her appt to pick them up with Raiford Noble and her husband picked them up. I explained that if she would have came to the appt they could have addressed it at that time. She also stated after her husband picked them up she wore them for 8 hrs yesterday and 3 hours today and she does not want them. She said she ordered a pair of the plantar fascitis shoes from online a while ago and that helped stop the tingling in her toes and with the orthotics it has came back. She said the orthotics are just a glorified Dr Margart Sickles insert. She asked if when he scanned her foot is it normal for him to pull back on her foot with the stick and I explained that it is normal. That when he does that it accentuates the arch.  She said she was under the impression that if they did not work she would be refunded the cost. I offered her an appt with Raiford Noble but she does not want to see him as she feels uncomfortable with him. She stated when he came in the room originally he commented how she smelled good. And he did not have his mask on. I also offered her an appt to see Raiford Noble when Dr Charlsie Merles was in the office and I could get Dr Charlsie Merles to come in the room as well. What are your recommendations?

## 2020-05-16 NOTE — Telephone Encounter (Signed)
Pt called back and left message stating she was to have gotten a letter of medical necessity for her to get orthopaedic shoes so that her hsa would cover them yesterday when her husband picked up the orthotics.

## 2020-05-21 ENCOUNTER — Telehealth: Payer: Self-pay | Admitting: *Deleted

## 2020-05-21 ENCOUNTER — Encounter: Payer: Self-pay | Admitting: Podiatry

## 2020-05-21 ENCOUNTER — Telehealth: Payer: Self-pay | Admitting: Podiatry

## 2020-05-21 NOTE — Telephone Encounter (Signed)
Returned patients call from today and she is now aware of the policy about refunding the money for the orthotics and is ok with [place] using it towards her balance and put in for a refund for the remaining but it would not be put back on the credit card.  She asked if she should return the orthotics and I told her no ma'am that they are custom made. As far as the letter of medical necessity she is going to try to see if Dr Beverlee Nims note with work with the hsa account. She mentioned the [facility] and I told pt that we do not recommend them as there is no doctor on staff. I offered her an appt with Dr Charlsie Merles again and she is going to think about it. Pt stated she has a pair of slippers that help with her pain and I told her if she decides to come in for an appt to bring them in for the doctor to see. She said thank you for calling and she will let me know if she needs and appt.

## 2020-05-21 NOTE — Telephone Encounter (Signed)
Left a voicemail message for patient to pick up their letter of medical necessity from Dr. Charlsie Merles at the front desk.

## 2020-05-21 NOTE — Telephone Encounter (Signed)
Keely:  I got letter done and called patient to come to our office to pick up from the front desk.  Patient's letter is in a box at the front desk behind the staff in a cabinet.  Thank you, Hannah Beat, CMA (AAMA)

## 2020-05-21 NOTE — Telephone Encounter (Signed)
Thank you Kathy!

## 2020-06-06 ENCOUNTER — Telehealth: Payer: BC Managed Care – PPO | Admitting: Family

## 2020-06-06 DIAGNOSIS — M5442 Lumbago with sciatica, left side: Secondary | ICD-10-CM

## 2020-06-06 MED ORDER — NAPROXEN 500 MG PO TABS: 500.0000 mg | ORAL_TABLET | Freq: Two times a day (BID) | ORAL | 0 refills | Status: DC

## 2020-06-06 MED ORDER — BACLOFEN 10 MG PO TABS: 10.0000 mg | ORAL_TABLET | Freq: Three times a day (TID) | ORAL | 0 refills | Status: DC

## 2020-06-06 NOTE — Progress Notes (Signed)

## 2020-09-16 ENCOUNTER — Telehealth: Payer: BC Managed Care – PPO | Admitting: Family

## 2020-09-16 DIAGNOSIS — A6000 Herpesviral infection of urogenital system, unspecified: Secondary | ICD-10-CM | POA: Diagnosis not present

## 2020-09-16 MED ORDER — ACYCLOVIR 400 MG PO TABS: 400.0000 mg | ORAL_TABLET | Freq: Three times a day (TID) | ORAL | 0 refills | Status: AC

## 2020-09-16 NOTE — Progress Notes (Signed)
E-Visit for Herpes Simplex  We are sorry that you are not feeling well.  Here is how we plan to help!  Based on what you have shared ith me, it looks like you may be having an outbreak/flare-up of genital herpes.    I have prescribed I have prescribed Acyclovir 400 mg Take on by mouth three times daily for 5 days.    If you have been prescribed long term medications to be taken on a regular basis, it is important to follow the recommendations and take them as ordered.    Outbreaks usually include blisters and open sores in the genital area. Outbreaks that happen after the first time are usually not as severe and do not last as long. Genital Herpes Simplex is a commonly sexually transmitted viral infection that is found worldwide. Most of these genital infections are caused by one or two herpes simplex viruses that is passed from person to person during vaginal, oral, or anal sex. Sometimes, people do not know they have herpes because they do not have any symptoms.  Please be aware that if you have genital herpes you can be contagious even when you are not having rash or flare-up and you may not have any symptoms, even when you are taking suppressive medicines.  Herpes cannot be cured. The disease usually causes most problems during the first few years. After that, the virus is still there, but it causes few to no symptoms. Even when the virus is active, people with herpes can take medicines to reduce and help prevent symptoms.  Herpes is an infection that can cause blisters and open sores on the genital area. Herpes is caused by a virus that is passed from person to person during vaginal, oral, or anal sex. Sometimes, people do not know they have herpes because they do not have any symptoms. Herpes cannot be cured. The disease usually causes most problems during the first few years. After that, the virus is still there, but it causes few to no symptoms. Even when the virus is active, people with herpes  can take medicines to reduce and help prevent symptoms.  If you have been prescribed medications to be taken on a regular basis, it is important to follow the recommendations and take them as ordered.  Some people with herpes never have any symptoms. But other people can develop symptoms within a few weeks of being infected with the herpes virus   Symptoms usually include blisters in the genital area. In women, this area includes the vagina, buttocks, anus, or thighs. In men, this area includes the penis, scrotum, anus, butt, or thighs. The blisters can become painful open sores, which then crust over as they heal. Sometimes, people can have other symptoms that include:  ?Blisters on the mouth or lips ?Fever, headache, or pain in the joints ?Trouble urinating  Outbreaks might occur every month or more often, or just once or twice a year. Sometimes, people can tell when an outbreak will occur, because they feel itching or pain beforehand. Sometimes they do not know that an outbreak is coming because they have no symptoms. Whatever your pattern is, keep in mind that herpes outbreaks usually become less frequent over time as you get older. Certain things, called "triggers," can make outbreaks more likely to occur. These include stress, sunlight, menstrual periods,or getting sick.  Antiviral therapy can shorten the duration of symptoms and signs in primary infection, which, when untreated, can be associated with significant increase in the   symptoms of the disease.  HOME CARE . Use a portable bath (such as a "Sitz bath") where you can sit in warm water for about 20 minutes. Your bathtub could also work. Avoid bubble baths.  . Keep the genital area clean and dry and avoid tight clothes.  . Take over-the-counter pain medicine such as acetaminophen (brand name: Tylenol) or ibuprofen sample brand names: Advil, Motrin). But avoid aspirin.  . Only take medications as instructed by your medical  team.  . You are most likely to spread herpes to a sex partner when you have blisters and open sores on your body. But it's also possible to spread herpes to your partner when you do not have any symptoms. That is because herpes can be present on your body without causing any symptoms, like blisters or pain.  . Telling your sex partner that you have herpes can be hard. But it can help protect them, since there are ways to lower the risk of spreading the infection.   . Using a condom every time you have sex  . Not having sex when you have symptoms  . Not having oral sex if you have blisters or open sores (in the genital area or around your mouth)  MAKE SURE YOU   . Understand these instructions. . Do not have sex without using a condom until you have been seen by a doctor and as instructed by the provider . If you are not better or improved within 7 days, you MUST have a follow up at your doctor or the health department for evaluation. There are other causes of rashes in the genital region.  Thank you for choosing an e-visit. Your e-visit answers were reviewed by a board certified advanced clinical practitioner to complete your personal care plan. Depending upon the condition, your plan could have included both over the counter or prescription medications.  Please review your pharmacy choice. Make sure the pharmacy is open so you can pick up prescription now. If there is a problem, you may contact your provider through [employer] and have the prescription routed to another pharmacy.  Your safety is important to [place]. If you have drug allergies check your prescription carefully.   For the next 24 hours you can use MyChart to ask questions about today's visit, request a non-urgent call back, or ask for a work or school excuse. You will get an email in the next two days asking about your experience. I hope that your e-visit has been valuable and will speed your  recovery    Approximately 5 minutes was spent documenting and reviewing patient's chart.

## 2020-10-11 ENCOUNTER — Telehealth: Payer: BC Managed Care – PPO | Admitting: Orthopedic Surgery

## 2020-10-11 DIAGNOSIS — A6000 Herpesviral infection of urogenital system, unspecified: Secondary | ICD-10-CM

## 2020-10-11 MED ORDER — ACYCLOVIR 400 MG PO TABS: 400.0000 mg | ORAL_TABLET | Freq: Three times a day (TID) | ORAL | 0 refills | Status: AC

## 2020-10-11 NOTE — Progress Notes (Signed)
E-Visit for Herpes Simplex  We are sorry that you are not feeling well.  Here is how we plan to help!  Based on what you have shared ith me, it looks like you may be having an outbreak/flare-up of genital herpes.    I have prescribed I have prescribed Acyclovir 400 mg Take on by mouth three times daily for 5 days.    If you have been prescribed long term medications to be taken on a regular basis, it is important to follow the recommendations and take them as ordered.    Outbreaks usually include blisters and open sores in the genital area. Outbreaks that happen after the first time are usually not as severe and do not last as long. Genital Herpes Simplex is a commonly sexually transmitted viral infection that is found worldwide. Most of these genital infections are caused by one or two herpes simplex viruses that is passed from person to person during vaginal, oral, or anal sex. Sometimes, people do not know they have herpes because they do not have any symptoms.  Please be aware that if you have genital herpes you can be contagious even when you are not having rash or flare-up and you may not have any symptoms, even when you are taking suppressive medicines.  Herpes cannot be cured. The disease usually causes most problems during the first few years. After that, the virus is still there, but it causes few to no symptoms. Even when the virus is active, people with herpes can take medicines to reduce and help prevent symptoms.  Herpes is an infection that can cause blisters and open sores on the genital area. Herpes is caused by a virus that is passed from person to person during vaginal, oral, or anal sex. Sometimes, people do not know they have herpes because they do not have any symptoms. Herpes cannot be cured. The disease usually causes most problems during the first few years. After that, the virus is still there, but it causes few to no symptoms. Even when the virus is active, people with herpes  can take medicines to reduce and help prevent symptoms.  If you have been prescribed medications to be taken on a regular basis, it is important to follow the recommendations and take them as ordered.  Some people with herpes never have any symptoms. But other people can develop symptoms within a few weeks of being infected with the herpes virus   Symptoms usually include blisters in the genital area. In women, this area includes the vagina, buttocks, anus, or thighs. In men, this area includes the penis, scrotum, anus, butt, or thighs. The blisters can become painful open sores, which then crust over as they heal. Sometimes, people can have other symptoms that include:  ?Blisters on the mouth or lips ?Fever, headache, or pain in the joints ?Trouble urinating  Outbreaks might occur every month or more often, or just once or twice a year. Sometimes, people can tell when an outbreak will occur, because they feel itching or pain beforehand. Sometimes they do not know that an outbreak is coming because they have no symptoms. Whatever your pattern is, keep in mind that herpes outbreaks usually become less frequent over time as you get older. Certain things, called "triggers," can make outbreaks more likely to occur. These include stress, sunlight, menstrual periods,or getting sick.  Antiviral therapy can shorten the duration of symptoms and signs in primary infection, which, when untreated, can be associated with significant increase in the   symptoms of the disease.  HOME CARE . Use a portable bath (such as a "Sitz bath") where you can sit in warm water for about 20 minutes. Your bathtub could also work. Avoid bubble baths.  . Keep the genital area clean and dry and avoid tight clothes.  . Take over-the-counter pain medicine such as acetaminophen (brand name: Tylenol) or ibuprofen sample brand names: Advil, Motrin). But avoid aspirin.  . Only take medications as instructed by your medical  team.  . You are most likely to spread herpes to a sex partner when you have blisters and open sores on your body. But it's also possible to spread herpes to your partner when you do not have any symptoms. That is because herpes can be present on your body without causing any symptoms, like blisters or pain.  . Telling your sex partner that you have herpes can be hard. But it can help protect them, since there are ways to lower the risk of spreading the infection.   . Using a condom every time you have sex  . Not having sex when you have symptoms  . Not having oral sex if you have blisters or open sores (in the genital area or around your mouth)  MAKE SURE YOU   . Understand these instructions. . Do not have sex without using a condom until you have been seen by a doctor and as instructed by the provider . If you are not better or improved within 7 days, you MUST have a follow up at your doctor or the health department for evaluation. There are other causes of rashes in the genital region.  Thank you for choosing an e-visit. Your e-visit answers were reviewed by a board certified advanced clinical practitioner to complete your personal care plan. Depending upon the condition, your plan could have included both over the counter or prescription medications.  Please review your pharmacy choice. Make sure the pharmacy is open so you can pick up prescription now. If there is a problem, you may contact your provider through [employer] and have the prescription routed to another pharmacy.  Your safety is important to [place]. If you have drug allergies check your prescription carefully.   For the next 24 hours you can use MyChart to ask questions about today's visit, request a non-urgent call back, or ask for a work or school excuse. You will get an email in the next two days asking about your experience. I hope that your e-visit has been valuable and will speed your  recovery         Greater than 5 minutes, yet less than 10 minutes of time have been spent researching, coordinating and implementing care for this patient today.

## 2020-10-22 ENCOUNTER — Telehealth: Payer: BC Managed Care – PPO | Admitting: Emergency Medicine

## 2020-10-22 DIAGNOSIS — A6000 Herpesviral infection of urogenital system, unspecified: Secondary | ICD-10-CM

## 2020-10-22 MED ORDER — VALACYCLOVIR HCL 500 MG PO TABS: 500.0000 mg | ORAL_TABLET | Freq: Two times a day (BID) | ORAL | 0 refills | Status: AC

## 2020-10-22 NOTE — Progress Notes (Signed)
E-Visit for Herpes Simplex  We are sorry that you are not feeling well.  Here is how we plan to help!  Based on what you have shared ith me, it looks like you may be having an outbreak/flare-up of genital herpes.    Let's try switching you over to Valtrex.  It might be more effective.  I agree with your plan to be seen in person.  You might also ask your doctor for a referral to infectious disease, as they might have some addition suggestions.  I have prescribed I have prescribed Valacyclovir 500 mg Take one by mouth twice a day for 3 days.    If you have been prescribed long term medications to be taken on a regular basis, it is important to follow the recommendations and take them as ordered.    Outbreaks usually include blisters and open sores in the genital area. Outbreaks that happen after the first time are usually not as severe and do not last as long. Genital Herpes Simplex is a commonly sexually transmitted viral infection that is found worldwide. Most of these genital infections are caused by one or two herpes simplex viruses that is passed from person to person during vaginal, oral, or anal sex. Sometimes, people do not know they have herpes because they do not have any symptoms.  Please be aware that if you have genital herpes you can be contagious even when you are not having rash or flare-up and you may not have any symptoms, even when you are taking suppressive medicines.  Herpes cannot be cured. The disease usually causes most problems during the first few years. After that, the virus is still there, but it causes few to no symptoms. Even when the virus is active, people with herpes can take medicines to reduce and help prevent symptoms.  Herpes is an infection that can cause blisters and open sores on the genital area. Herpes is caused by a virus that is passed from person to person during vaginal, oral, or anal sex. Sometimes, people do not know they have herpes because they do not  have any symptoms. Herpes cannot be cured. The disease usually causes most problems during the first few years. After that, the virus is still there, but it causes few to no symptoms. Even when the virus is active, people with herpes can take medicines to reduce and help prevent symptoms.  If you have been prescribed medications to be taken on a regular basis, it is important to follow the recommendations and take them as ordered.  Some people with herpes never have any symptoms. But other people can develop symptoms within a few weeks of being infected with the herpes virus   Symptoms usually include blisters in the genital area. In women, this area includes the vagina, buttocks, anus, or thighs. In men, this area includes the penis, scrotum, anus, butt, or thighs. The blisters can become painful open sores, which then crust over as they heal. Sometimes, people can have other symptoms that include:  ?Blisters on the mouth or lips ?Fever, headache, or pain in the joints ?Trouble urinating  Outbreaks might occur every month or more often, or just once or twice a year. Sometimes, people can tell when an outbreak will occur, because they feel itching or pain beforehand. Sometimes they do not know that an outbreak is coming because they have no symptoms. Whatever your pattern is, keep in mind that herpes outbreaks usually become less frequent over time as you get  older. Certain things, called "triggers," can make outbreaks more likely to occur. These include stress, sunlight, menstrual periods,or getting sick.  Antiviral therapy can shorten the duration of symptoms and signs in primary infection, which, when untreated, can be associated with significant increase in the symptoms of the disease.  HOME CARE . Use a portable bath (such as a "Sitz bath") where you can sit in warm water for about 20 minutes. Your bathtub could also work. Avoid bubble baths.  . Keep the genital area clean and dry and avoid  tight clothes.  . Take over-the-counter pain medicine such as acetaminophen (brand name: Tylenol) or ibuprofen sample brand names: Advil, Motrin). But avoid aspirin.  . Only take medications as instructed by your medical team.  . You are most likely to spread herpes to a sex partner when you have blisters and open sores on your body. But it's also possible to spread herpes to your partner when you do not have any symptoms. That is because herpes can be present on your body without causing any symptoms, like blisters or pain.  . Telling your sex partner that you have herpes can be hard. But it can help protect them, since there are ways to lower the risk of spreading the infection.   . Using a condom every time you have sex  . Not having sex when you have symptoms  . Not having oral sex if you have blisters or open sores (in the genital area or around your mouth)  MAKE SURE YOU   . Understand these instructions. . Do not have sex without using a condom until you have been seen by a doctor and as instructed by the provider . If you are not better or improved within 7 days, you MUST have a follow up at your doctor or the health department for evaluation. There are other causes of rashes in the genital region.  Thank you for choosing an e-visit. Your e-visit answers were reviewed by a board certified advanced clinical practitioner to complete your personal care plan. Depending upon the condition, your plan could have included both over the counter or prescription medications.  Please review your pharmacy choice. Make sure the pharmacy is open so you can pick up prescription now. If there is a problem, you may contact your provider through [employer] and have the prescription routed to another pharmacy.  Your safety is important to [place]. If you have drug allergies check your prescription carefully.   For the next 24 hours you can use MyChart to ask questions about today's visit, request  a non-urgent call back, or ask for a work or school excuse. You will get an email in the next two days asking about your experience. I hope that your e-visit has been valuable and will speed your recovery         Approximately 5 minutes was used in reviewing the patient's chart, questionnaire, prescribing medications, and documentation.

## 2020-11-12 ENCOUNTER — Telehealth: Payer: BC Managed Care – PPO | Admitting: Physician Assistant

## 2020-11-12 ENCOUNTER — Encounter: Payer: Self-pay | Admitting: Physician Assistant

## 2020-11-12 DIAGNOSIS — B370 Candidal stomatitis: Secondary | ICD-10-CM

## 2020-11-12 DIAGNOSIS — A6000 Herpesviral infection of urogenital system, unspecified: Secondary | ICD-10-CM

## 2020-11-12 DIAGNOSIS — F419 Anxiety disorder, unspecified: Secondary | ICD-10-CM | POA: Diagnosis not present

## 2020-11-12 DIAGNOSIS — R030 Elevated blood-pressure reading, without diagnosis of hypertension: Secondary | ICD-10-CM | POA: Diagnosis not present

## 2020-11-12 NOTE — Progress Notes (Signed)
[name], Shannon Jarvis are scheduled for a virtual visit with your provider today.    Just as we do with appointments in the office, we must obtain your consent to participate.  Your consent will be active for this visit and any virtual visit you may have with one of our providers in the next 365 days.    If you have a MyChart account, I can also send a copy of this consent to you electronically.  All virtual visits are billed to your insurance company just like a traditional visit in the office.  As this is a virtual visit, video technology does not allow for your provider to perform a traditional examination.  This may limit your provider's ability to fully assess your condition.  If your provider identifies any concerns that need to be evaluated in person or the need to arrange testing such as labs, EKG, etc, we will make arrangements to do so.    Although advances in technology are sophisticated, we cannot ensure that it will always work on either your end or our end.  If the connection with a video visit is poor, we may have to switch to a telephone visit.  With either a video or telephone visit, we are not always able to ensure that we have a secure connection.   I need to obtain your verbal consent now.   Are you willing to proceed with your visit today?   Shannon Jarvis has provided verbal consent on 11/12/2020 for a virtual visit (video or telephone).  Shannon Climes, PA-C 11/12/2020  1:59 PM  Virtual Visit via Video   I connected with patient on 11/12/20 at  2:15 PM EST by a video enabled telemedicine application and verified that I am speaking with the correct person using two identifiers.  Location patient: Home Location provider: [facility] Persons participating in the virtual visit: Patient, Provider  I discussed the limitations of evaluation and management by telemedicine and the availability of in person appointments. The patient expressed understanding and agreed to  proceed.  Subjective:   HPI:   Patient presents via Care[facility] today to discuss worsening of her anxiety.  Patient endorses longstanding history of bipolar disorder and generalized anxiety, initially diagnosed at age 35.  Has not taken medication in many years as she does not like how they make her feel.  Notes mood is stable overall but recently anxiety has been increased due to some health issues.  States overall she has been a very healthy individual.  In the fall of last year she started noting substantial issue with weight gain and fatigue.  Was diagnosed with hypothyroidism by previous primary care provider and started on a regimen of levothyroxine 25 mcg daily.  Endorses taking her medication as directed and tolerating well overall.  Has upcoming appointment with new PCP and endocrinologist to reassess her thyroid state.  Notes although her energy is improved anxiety is worsened.  Notes having much more substantial levels of anxiety with increased panic attacks, most recently this morning.  Notes in the past couple of months she has been having frequent HSV-2 outbreaks.  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  Also recently treated for thrush.  Was told something was wrong with her immune system and should need evaluation.  Notes this is made her substantially anxious.  Checked her blood pressure during her panic attack and noted to be elevated at 148/96 and  at 160/106 during another panic attack.  Denies any history of hypertension.  Denies chest pain dizziness or syncope.  Denies suicidal thought or ideation.  Is wanting reassurance regarding her blood pressure and other issues prior to her visit on Wednesday.  Is seeing a therapist once weekly and has a call out to them to see if they can see her tomorrow giving increase in anxiety.  Also has an appointment with psychiatrist next Thursday.  ROS:   See pertinent positives and negatives per HPI.  Patient  Active Problem List   Diagnosis Date Noted  . Drug allergy 02/08/2018  . Pre-eclampsia 02/08/2018  . Pregnancy 04/07/2016  . Polyhydramnios in third trimester, antepartum 04/05/2016  . Gestational thrombocytopenia (HCC) 04/02/2016  . Group B Streptococcus carrier, +RV culture, currently pregnant 03/27/2016  . Supervision of normal pregnancy, antepartum 10/09/2015  . 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 History of pre-eclampsia in prior pregnancy, currently pregnant 11/01/2013  . Genital herpes simplex 09/30/2003    Social History   Tobacco Use  . Smoking status: Former Smoker    Packs/day: 0.00    Types: Cigarettes    Quit date: 10/03/2015    Years since quitting: 5.1  . Smokeless tobacco: Never Used  Substance Use Topics  . Alcohol use: No    Current Outpatient Medications:  .  ALPRAZolam (XANAX) 1 MG tablet, alprazolam 1 mg tablet  TAKE 1 TABLET BY MOUTH 3 TIMES A DAY, Disp: , Rfl:  .  baclofen (LIORESAL) 10 MG tablet, Take 1 tablet (10 mg total) by mouth 3 (three) times daily., Disp: 30 each, Rfl: 0 .  carbamazepine (TEGRETOL) 200 MG tablet, carbamazepine 200 mg tablet  TAKE 1 TABLET BY MOUTH 3 TIMES A DAY, Disp: , Rfl:  .  chlorhexidine (PERIDEX) 0.12 % solution, chlorhexidine gluconate 0.12 % mouthwash  RINSE FOR 15 MLS FOR 30 SECONDS EVERY MORNING AND EVENING FOR 2 WEEKS**BEGIN AFTER 24 HRS, Disp: , Rfl:  .  drospirenone-ethinyl estradiol (LORYNA) 3-0.02 MG tablet, Loryna (28) 3 mg-0.02 mg tablet  TAKE 1 TABLET(S) EVERY DAY BY ORAL ROUTE AT BEDTIME FOR 28 DAYS., Disp: , Rfl:  .  FLUoxetine (PROZAC) 20 MG capsule, fluoxetine 20 mg capsule  TAKE 1 CAP BY MOUTH EVERY MORNING X2 WEEKS, 2 CAPS BY MOUTH X 2 WEEKS THEN 3 CAPS DAILY, Disp: , Rfl:  .  naproxen (NAPROSYN) 500 MG tablet, Take 1 tablet (500 mg total) by mouth 2 (two) times daily with a meal., Disp: 30 tablet, Rfl: 0 .  oxyCODONE-acetaminophen (PERCOCET/ROXICET) 5-325 MG tablet,  oxycodone-acetaminophen 5 mg-325 mg tablet  TAKE 1 TABLET BY MOUTH EVERY 6 HOURS AS NEEDED FOR PAIN, Disp: , Rfl:  .  Prenat-FeCbn-FeAsp-Meth-FA-DHA (PRENATE MINI) 18-0.6-0.4-350 MG CAPS, Prenate Mini (ferrous asparto glycinate) 18 mg-1 mg-350 mg capsule  Take 1 capsule every day by oral route., Disp: , Rfl:  .  Prenat-FeFmCb-DSS-FA-DHA w/o A (CITRANATAL HARMONY) 27-1-260 MG CAPS, CitraNatal Harmony(iron carb-fum) 27 mg iron-1 mg-50 mg-260 mg capsule  Take 1 capsule every day by oral route., Disp: , Rfl:  .  Prenatal Vit-Fe Fumarate-FA (PRENATAL 1+1 PO), Prenatal  1 q day, Disp: , Rfl:  .  promethazine (PHENERGAN) 25 MG tablet, promethazine 25 mg tablet  TAKE 1 TABLET BY MOUTH EVERY 6 HOURS AS NEEDED FOR NAUSEA, Disp: , Rfl:  .  propranolol (INDERAL) 10 MG tablet, propranolol 10 mg tablet  TAKE 1 TO 2 TABLETS BY MOUTH 4 TIMES A DAY AS NEEDED FOR  ANXIETY OR PANIC ATTACKS, Disp: , Rfl:  .  ranitidine (ZANTAC) 150 MG capsule, Take 150 mg by mouth 2 (two) times daily., Disp: , Rfl:  .  traZODone (DESYREL) 100 MG tablet, trazodone 100 mg tablet  TAKE 1/2 TO 3 TABLETS BY MOUTH AT BEDTIME, Disp: , Rfl:  .  valACYclovir (VALTREX) 500 MG tablet, Take 1 tablet (500 mg total) by mouth 2 (two) times daily., Disp: 6 tablet, Rfl: 0  Allergies  Allergen Reactions  . Augmentin [Amoxicillin-Pot Clavulanate] Hives and Other (See Comments)    Joint swelling Has patient had a PCN reaction causing immediate rash, facial/tongue/throat swelling, SOB or lightheadedness with hypotension: Yes Has patient had a PCN reaction causing severe rash involving mucus membranes or skin necrosis: Yes Has patient had a PCN reaction that required hospitalization Yes Has patient had a PCN reaction occurring within the last 10 years: No If all of the above answers are "NO", then may proceed with Cephalosporin use.     Objective:   There were no vitals taken for this visit.  Patient is well-developed, well-nourished in no acute  distress.  Resting comfortably at home.  Head is normocephalic, atraumatic.  No labored breathing.  Speech is clear and coherent with logical content.  Patient is alert and oriented at baseline.   Assessment and Plan:   1. Acute anxiety Unclear etiology of this exacerbation as initially it seemed anxiety has been increasing before her other health issues started.  Now being further exacerbated by recent health issues, causing a vicious cycle.  Discussed with her we did not start medications for anxiety and depression via these visits.  She has an appointment scheduled with her new PCP and psychiatry.  Continue management per these providers.  Continue counseling.  Patient did seem to be be a lot better after reassurance was given to her regarding her recent blood pressure measurements.  2. Genital herpes simplex, unspecified site No current outbreak but increased in frequency.  Suspect most likely due to her increase in anxiety although needs further evaluation.  She has been encouraged to follow-up with her new PCP who she has an appointment with the Wednesday to reassess her thyroid state as well as to check further labs for potential cause, I.e DM II or other immune deficient state.Marland Kitchen Hopefully these will be normal as suspected and reassurance given. Feel once anxiety controlled these episodes will calm down. May need to consider prophylactic antiviral.   3. Thrush Recently treated with nystatin and Diflucan.  Needs follow-up with primary care provider.  4. Elevated BP without diagnosis of hypertension Discussed this is most likely due to her anxiety level.  Needs reassessment of thyroid function along with an in office manual blood pressure check while not having a panic attack.  DASH diet reviewed.  Handout given.  Continue counseling.    Shannon Jarvis, [place] 11/12/2020

## 2020-11-14 ENCOUNTER — Telehealth: Payer: BC Managed Care – PPO | Admitting: Family

## 2020-11-14 ENCOUNTER — Encounter: Payer: Self-pay | Admitting: Family

## 2020-11-14 DIAGNOSIS — B370 Candidal stomatitis: Secondary | ICD-10-CM

## 2020-11-14 MED ORDER — CLOTRIMAZOLE 10 MG MT TROC: 10.0000 mg | Freq: Every day | OROMUCOSAL | 0 refills | Status: AC

## 2020-11-14 MED ORDER — FLUCONAZOLE 150 MG PO TABS: 150.0000 mg | ORAL_TABLET | ORAL | 0 refills | Status: AC | PRN

## 2020-11-14 NOTE — Progress Notes (Signed)
Virtual Visit via telephone Note Due to COVID-19 pandemic this visit was conducted virtually. This visit type was conducted due to national recommendations for restrictions regarding the COVID-19 Pandemic (e.g. social distancing, sheltering in place) in an effort to limit this patient's exposure and mitigate transmission in our community. All issues noted in this document were discussed and addressed.  A physical exam was not performed with this format.  I connected with Shannon Jarvis on 11/14/20 at 3:34 pm  by video and verified that I am speaking with the correct person using two identifiers. Shannon Jarvis is currently located at car and no one is currently with her during visit. The provider, Jannifer Rodney, FNP is located in their office at time of visit.  Shannon Jarvis, Shannon Jarvis are scheduled for a virtual visit with your provider today.    Just as we do with appointments in the office, we must obtain your consent to participate.  Your consent will be active for this visit and any virtual visit you may have with one of our providers in the next 365 days.    If you have a MyChart account, I can also send a copy of this consent to you electronically.  All virtual visits are billed to your insurance company just like a traditional visit in the office.  As this is a virtual visit, video technology does not allow for your provider to perform a traditional examination.  This may limit your provider's ability to fully assess your condition.  If your provider identifies any concerns that need to be evaluated in person or the need to arrange testing such as labs, EKG, etc, we will make arrangements to do so.    Although advances in technology are sophisticated, we cannot ensure that it will always work on either your end or our end.  If the connection with a video visit is poor, we may have to switch to a telephone visit.  With either a video or telephone visit, we are not always able to ensure that we have a  secure connection.   I need to obtain your verbal consent now.   Are you willing to proceed with your visit today?   Shannon Jarvis has provided verbal consent on 11/14/2020 for a virtual visit (video or telephone).   Jannifer Rodney, [place] 11/14/2020  3:37 PM     I discussed the limitations, risks, security and privacy concerns of performing an evaluation and management service by telephone and the availability of in person appointments. I also discussed with the patient that there may be a patient responsible charge related to this service. The patient expressed understanding and agreed to proceed.   History and Present Illness:  HPI  PT complaining of oral thrush last week with a white coating on her tongue. She did a [facility] live visit 11/07/20 and nystatin solution that helped. However, she felt like over the weekend her symptoms started to worsen with vaginal symptoms. She started diflucan.    She states she has a mild tenderness under her tongue and in her throat. These have improved since taking the medication.   Review of Systems  All other systems reviewed and are negative.    Observations/Objective: Pt looks well, white coating on her tongue.    Assessment and Plan: 1. Oral thrush Keep clean and dry Rinse mouth out after meals PT will follow up with PCP for further lab work - fluconazole (DIFLUCAN) 150 MG tablet; Take 1 tablet (150 mg total)  by mouth every three (3) days as needed.  Dispense: 3 tablet; Refill: 0 - clotrimazole (MYCELEX) 10 MG troche; Take 1 tablet (10 mg total) by mouth 5 (five) times daily for 5 days.  Dispense: 25 tablet; Refill: 0      I discussed the assessment and treatment plan with the patient. The patient was provided an opportunity to ask questions and all were answered. The patient agreed with the plan and demonstrated an understanding of the instructions.   The patient was advised to call back or seek an in-person evaluation if the symptoms  worsen or if the condition fails to improve as anticipated.  The above assessment and management plan was discussed with the patient. The patient verbalized understanding of and has agreed to the management plan. Patient is aware to call the clinic if symptoms persist or worsen. Patient is aware when to return to the clinic for a follow-up visit. Patient educated on when it is appropriate to go to the emergency department.   Time call ended:  3:51 pm   I provided 17 minutes of -face-to-face time during this encounter.    Jannifer Rodney, FNP

## 2020-11-16 ENCOUNTER — Other Ambulatory Visit: Payer: Self-pay | Admitting: Obstetrics and Gynecology

## 2020-11-20 ENCOUNTER — Encounter: Payer: Self-pay | Admitting: Physician Assistant

## 2020-11-20 ENCOUNTER — Telehealth: Payer: BC Managed Care – PPO | Admitting: Physician Assistant

## 2020-11-20 DIAGNOSIS — R220 Localized swelling, mass and lump, head: Secondary | ICD-10-CM

## 2020-11-20 DIAGNOSIS — B370 Candidal stomatitis: Secondary | ICD-10-CM | POA: Diagnosis not present

## 2020-11-20 NOTE — Patient Instructions (Addendum)
Instructions sent to patients MyChart.

## 2020-11-20 NOTE — Progress Notes (Signed)
[name], Shannon Jarvis are scheduled for a virtual visit with your provider today.    Just as we do with appointments in the office, we must obtain your consent to participate.  Your consent will be active for this visit and any virtual visit you may have with one of our providers in the next 365 days.    If you have a MyChart account, I can also send a copy of this consent to you electronically.  All virtual visits are billed to your insurance company just like a traditional visit in the office.  As this is a virtual visit, video technology does not allow for your provider to perform a traditional examination.  This may limit your provider's ability to fully assess your condition.  If your provider identifies any concerns that need to be evaluated in person or the need to arrange testing such as labs, EKG, etc, we will make arrangements to do so.    Although advances in technology are sophisticated, we cannot ensure that it will always work on either your end or our end.  If the connection with a video visit is poor, we may have to switch to a telephone visit.  With either a video or telephone visit, we are not always able to ensure that we have a secure connection.   I need to obtain your verbal consent now.   Are you willing to proceed with your visit today?   Shannon Jarvis has provided verbal consent on 11/20/2020 for a virtual visit (video or telephone).   Shannon Climes, PA-C 11/20/2020  12:59 PM     Virtual Visit via Video   I connected with patient on 11/20/20 at  1:00 PM EST by a video enabled telemedicine application and verified that I am speaking with the correct person using two identifiers.  Location patient: Home Location provider: [facility] Persons participating in the virtual visit: Patient, Provider  I discussed the limitations of evaluation and management by telemedicine and the availability of in person appointments. The patient expressed understanding and  agreed to proceed.  Subjective:   HPI:   Patient presents via [facility] today c/o recurrent "thrush" of the mouth. Has been seen and evaluated by multiple providers over the past few weeks, including myself.  Was initially placed on nystatin by an [facility] provider.  Took this as directed with only mild improvement.  Then had Diflucan added by this provider which she states she tolerated well.  States this helps some but symptoms quickly recurred.  Had an ED visit in which she was prescribed 3 days of Diflucan.  Is taking currently with 1 day left of medicine but still having symptoms.  Denies pain or difficulty swallowing.  Denies fever or chills.  Has noted some mild swelling of her upper lip.  Has history of HSV but denies any cold sore outbreak.  Is scheduled to see her primary care provider but not until 12/24/2020.  States she figured she may need to go to an urgent care but wanted to check with [place] first.   ROS:   See pertinent positives and negatives per HPI.  Patient Active Problem List   Diagnosis Date Noted  . Drug allergy 02/08/2018  . Pre-eclampsia 02/08/2018  . Pregnancy 04/07/2016  . Polyhydramnios in third trimester, antepartum 04/05/2016  . Gestational thrombocytopenia (HCC) 04/02/2016  . Group B Streptococcus carrier, +RV culture, currently pregnant 03/27/2016  . Supervision of normal pregnancy, antepartum 10/09/2015  . HSV infection--10/11/13  11/02/2013  . 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 History of pre-eclampsia in prior pregnancy, currently pregnant 11/01/2013  . Genital herpes simplex 09/30/2003    Social History   Tobacco Use  . Smoking status: Former Smoker    Packs/day: 0.00    Types: Cigarettes    Quit date: 10/03/2015    Years since quitting: 5.1  . Smokeless tobacco: Never Used  Substance Use Topics  . Alcohol use: No    Current Outpatient Medications:  .  fluconazole (DIFLUCAN) 150 MG tablet, Take 1 tablet (150 mg total) by mouth every three (3) days as  needed., Disp: 3 tablet, Rfl: 0 .  levothyroxine (SYNTHROID) 25 MCG tablet, Take 25 mcg by mouth daily., Disp: , Rfl:  .  nystatin (MYCOSTATIN) 100000 UNIT/ML suspension, Take 5 mLs by mouth 4 (four) times daily., Disp: , Rfl:  .  valACYclovir (VALTREX) 500 MG tablet, Take 1 tablet (500 mg total) by mouth 2 (two) times daily., Disp: 6 tablet, Rfl: 0  Allergies  Allergen Reactions  . Augmentin [Amoxicillin-Pot Clavulanate] Hives and Other (See Comments)    Joint swelling Has patient had a PCN reaction causing immediate rash, facial/tongue/throat swelling, SOB or lightheadedness with hypotension: Yes Has patient had a PCN reaction causing severe rash involving mucus membranes or skin necrosis: Yes Has patient had a PCN reaction that required hospitalization Yes Has patient had a PCN reaction occurring within the last 10 years: No If all of the above answers are "NO", then may proceed with Cephalosporin use.     Objective:   There were no vitals taken for this visit.  Patient is well-developed, well-nourished in no acute distress.  Resting comfortably at home.  Head is normocephalic, atraumatic.  No labored breathing.  Speech is clear and coherent with logical content.  Patient is alert and oriented at baseline.  Scalloped tongue markings noted. Mild erythema of lower tongue noted bilaterally. Mild upper lip swelling.  Assessment and Plan:   1. Oral thrush 2. Lip swelling Given recurrence of oral issue, discussed with patient she need further evaluation and in person assessment.  We need to know if this is actually thrush or if there is another cause of her symptoms, especially giving mild lip swelling.  Recommend she take an antihistamine to help calm down the lip swelling until she can be seen today.  Recommend she first reach out to her dentist.  If unable to see her, she is to be evaluated at a local urgent care.  Patient voiced understanding and agreement with the  plan.    Shannon Jarvis, [place] 11/20/2020

## 2020-11-27 ENCOUNTER — Other Ambulatory Visit: Payer: Self-pay | Admitting: Obstetrics and Gynecology

## 2020-11-27 DIAGNOSIS — Z1231 Encounter for screening mammogram for malignant neoplasm of breast: Secondary | ICD-10-CM

## 2020-11-29 DIAGNOSIS — Z1231 Encounter for screening mammogram for malignant neoplasm of breast: Secondary | ICD-10-CM

## 2020-12-10 DIAGNOSIS — L439 Lichen planus, unspecified: Secondary | ICD-10-CM | POA: Insufficient documentation

## 2020-12-24 ENCOUNTER — Encounter: Payer: Self-pay | Admitting: Physician Assistant

## 2020-12-24 ENCOUNTER — Other Ambulatory Visit: Payer: Self-pay

## 2020-12-24 ENCOUNTER — Ambulatory Visit (INDEPENDENT_AMBULATORY_CARE_PROVIDER_SITE_OTHER): Payer: BC Managed Care – PPO | Admitting: Physician Assistant

## 2020-12-24 VITALS — BP 124/70 | HR 75 | Temp 97.9°F | Ht 65.0 in | Wt 171.8 lb

## 2020-12-24 DIAGNOSIS — Z136 Encounter for screening for cardiovascular disorders: Secondary | ICD-10-CM

## 2020-12-24 DIAGNOSIS — Q383 Other congenital malformations of tongue: Secondary | ICD-10-CM

## 2020-12-24 DIAGNOSIS — Z13220 Encounter for screening for lipoid disorders: Secondary | ICD-10-CM | POA: Diagnosis not present

## 2020-12-24 DIAGNOSIS — F422 Mixed obsessional thoughts and acts: Secondary | ICD-10-CM | POA: Diagnosis not present

## 2020-12-24 DIAGNOSIS — L438 Other lichen planus: Secondary | ICD-10-CM

## 2020-12-24 DIAGNOSIS — Z114 Encounter for screening for human immunodeficiency virus [HIV]: Secondary | ICD-10-CM

## 2020-12-24 DIAGNOSIS — F411 Generalized anxiety disorder: Secondary | ICD-10-CM | POA: Insufficient documentation

## 2020-12-24 DIAGNOSIS — R509 Fever, unspecified: Secondary | ICD-10-CM

## 2020-12-24 DIAGNOSIS — F429 Obsessive-compulsive disorder, unspecified: Secondary | ICD-10-CM | POA: Insufficient documentation

## 2020-12-24 DIAGNOSIS — F319 Bipolar disorder, unspecified: Secondary | ICD-10-CM

## 2020-12-24 DIAGNOSIS — Z1159 Encounter for screening for other viral diseases: Secondary | ICD-10-CM

## 2020-12-24 LAB — POCT URINE PREGNANCY: Preg Test, Ur: NEGATIVE

## 2020-12-24 NOTE — Patient Instructions (Addendum)
It was great to see you!  Update blood work and urine tests today.  Keep me posted on what rheumatology says.  I will put in referral for oral surgeon. You will be contacted for an appointment. Please note that routine referrals can sometimes take up to 3-4 weeks to process. Please call our office if you haven't heard anything after this time frame.  Take care,  Jarold Motto PA-C

## 2020-12-24 NOTE — Progress Notes (Signed)
Shannon Jarvis is a 35 y.o. female here for a follow up of a pre-existing problem.  History of Present Illness:   Chief Complaint  Patient presents with  . Lichen Planus    Originally thought it was oral thrush. Started February 4th, has not responded to any medications given. Has seen an ENT.  . Weight Gain    Lupus runs in family,has had some weight gain and gets occasional fevers. Twice a month maybe, has been seen with an OB previously. Associated with hair loss.    HPI   OCD/GAD/Bipolar I Information obtained from patient and via chart review. Initially diagnosed at the age of 71. Has been overall healthy, was given xanax for about 8 years and took this daily. Got up to about 4 mg daily and did not like the way that it made her feel or how long it took her to get off of this. She does not want to restart medications. She currently sees a therapist. She does endorse symptoms of mania at times but feels like she is able to manage these sx without medications.  Joint pain/Weight gain/Concern for autoimmune issues She considers herself to be pretty healthy prior to [place] pandemic. With time however, she has developed b/l constant hip pain and has stopped running, which was her main form of exercise. She has gained about 40 lb due to this and ongoing health anxiety. She has had increased fatigue and heavier, more irregular periods and hair thinning. Her ob-gyn tested her thyroid and she had mildly low T3 uptake but other thyroid labs were normal and put her on levothyroxine 25 mcg daily. Repeat labs showed similar results. She saw Dr. Hartford Poli in endocrinology in Feb 2022 and he recommended stopping levothyroxine, which she did. He also checked a CK, CBC w/ diff and CMP which were all normal. She reports that her mom has RA. 2 maternal aunts with lupus. 3 first cousins with lupus. She actually has an appointment with an endocrinologist next week.  Tongue Abnormality/Fevers Last year started  getting intermittent fevers. Temps can go up to 101 at times. Will take medication such as ibuprofen/tylenol which will break fever. She doesn't get any other symptoms (except for the myriad of symptoms listed above.) She started having   Thrush Tried diflucan and nystatin without relief. Sx started in Feb. She developed dry mouth, with a white coating/film in her mouth. The area will peel off at times. She endorses swelling in her jaw and cheeks, also in her lips. Trial oral diflucan, nystatin mouthwash and clotrimazole lozenges all without significant relief. Went to ENT on 12/10/20 and was told that she has lichen planus and needs to be biopsied. UTD with dentist who briefly saw her and also stated that she had lichen planus -- has a cleaning scheduled for next week. She did smoke in college and a few years after.   ENT referred her to dermatology and rheumatology.    Past Medical History:  Diagnosis Date  . Genital herpes   . Pregnancy induced hypertension      Social History   Tobacco Use  . Smoking status: Former Smoker    Packs/day: 0.00    Types: Cigarettes    Quit date: 10/03/2015    Years since quitting: 5.2  . Smokeless tobacco: Never Used  Substance Use Topics  . Alcohol use: No  . Drug use: No    Past Surgical History:  Procedure Laterality Date  . TONSILLECTOMY    .  WISDOM TOOTH EXTRACTION  07/10/2015    Family History  Problem Relation Age of Onset  . Rheum arthritis Mother   . Hypertension Father   . Cancer Paternal Grandmother        Breast  . Lupus Maternal Aunt   . Lupus Cousin   . Lupus Cousin   . Lupus Cousin   . Lupus Maternal Aunt     Allergies  Allergen Reactions  . Augmentin [Amoxicillin-Pot Clavulanate] Hives and Other (See Comments)    Joint swelling Has patient had a PCN reaction causing immediate rash, facial/tongue/throat swelling, SOB or lightheadedness with hypotension: Yes Has patient had a PCN reaction causing severe rash involving  mucus membranes or skin necrosis: Yes Has patient had a PCN reaction that required hospitalization Yes Has patient had a PCN reaction occurring within the last 10 years: No If all of the above answers are "NO", then may proceed with Cephalosporin use.     Current Medications:   Current Outpatient Medications:  .  clotrimazole (MYCELEX) 10 MG troche, Take by mouth., Disp: , Rfl:  .  fluconazole (DIFLUCAN) 150 MG tablet, Take 1 tablet (150 mg total) by mouth every three (3) days as needed. (Patient not taking: Reported on 12/24/2020), Disp: 3 tablet, Rfl: 0 .  nystatin (MYCOSTATIN) 100000 UNIT/ML suspension, Take 5 mLs by mouth 4 (four) times daily. (Patient not taking: Reported on 12/24/2020), Disp: , Rfl:  .  valACYclovir (VALTREX) 500 MG tablet, Take 1 tablet (500 mg total) by mouth 2 (two) times daily., Disp: 6 tablet, Rfl: 0   Review of Systems:   ROS  Negative unless otherwise specified per HPI.   Vitals:   Vitals:   12/24/20 1541  BP: 124/70  Pulse: 75  Temp: 97.9 F (36.6 C)  TempSrc: Temporal  SpO2: 99%  Weight: 171 lb 12.8 oz (77.9 kg)  Height: '5\' 5"'  (1.651 m)     Body mass index is 28.59 kg/m.  Physical Exam:   Physical Exam Vitals and nursing note reviewed.  Constitutional:      General: She is not in acute distress.    Appearance: She is well-developed. She is not ill-appearing or toxic-appearing.  HENT:     Mouth/Throat:     Comments: White film present on edges of tongue and buccal mucosa Cardiovascular:     Rate and Rhythm: Normal rate and regular rhythm.     Pulses: Normal pulses.     Heart sounds: Normal heart sounds, S1 normal and S2 normal.     Comments: No LE edema Pulmonary:     Effort: Pulmonary effort is normal.     Breath sounds: Normal breath sounds.  Lymphadenopathy:     Cervical: Cervical adenopathy present.  Skin:    General: Skin is warm and dry.  Neurological:     Mental Status: She is alert.     GCS: GCS eye subscore is 4. GCS  verbal subscore is 5. GCS motor subscore is 6.  Psychiatric:        Attention and Perception: Attention normal.        Mood and Affect: Mood is anxious. Affect is tearful.        Speech: Speech normal.        Behavior: Behavior normal. Behavior is cooperative.     Results for orders placed or performed in visit on 12/24/20  POCT urine pregnancy  Result Value Ref Range   Preg Test, Ur Negative Negative    Assessment and Plan:  Mckaylin was seen today for lichen planus and weight gain.  Diagnoses and all orders for this visit:  Fever, unspecified fever cause Unclear etiology. Reviewed recent tests in care everywhere which were unremarkable. She is planning to see rheumatology next week, so I will defer all autoimmune testing to that provider. Will update: HIV, RPR, hep C, UA. After rheum referral and these results, if persists, will consider imaging and/or ID referral. -     HIV Antibody (routine testing w rflx) -     RPR -     Hepatitis C Antibody -     Urinalysis, Routine w reflex microscopic; Future -     POCT urine pregnancy -     Urinalysis, Routine w reflex microscopic  Abnormality of tongue; Oral lichen planus Per ENT and dentist, needs referral to oral maxillofacial surgeon for biopsy. I have put this in for patient today. -     VITAMIN D 25 Hydroxy (Vit-D Deficiency, Fractures) -     Vitamin B12 -     Ambulatory referral to Oral Maxillofacial Surgery  Screening for HIV (human immunodeficiency virus) -     HIV Antibody (routine testing w rflx)  Encounter for screening for other viral diseases -     Hepatitis C Antibody  Encounter for lipid screening for cardiovascular disease -     Lipid panel  GAD (generalized anxiety disorder); Mixed obsessional thoughts and acts; Bipolar 1 disorder ([place]) Denies SI/HI. Denies medication, psychiatry referral or other needs at this time. Continue talk therapy. I discussed with patient that if they develop any SI, to tell  someone immediately and seek medical attention.  Time spent with patient today was 45 minutes which consisted of chart review, discussing diagnosis, work up, treatment answering questions and documentation.   Inda Coke, PA-C

## 2020-12-25 LAB — URINALYSIS, ROUTINE W REFLEX MICROSCOPIC
Bilirubin Urine: NEGATIVE
Hgb urine dipstick: NEGATIVE
Ketones, ur: NEGATIVE
Leukocytes,Ua: NEGATIVE
Nitrite: NEGATIVE
RBC / HPF: NONE SEEN (ref 0–?)
Specific Gravity, Urine: <=1.005 — AB (ref 1.000–1.030)
Total Protein, Urine: NEGATIVE
Urine Glucose: NEGATIVE
Urobilinogen, UA: 0.2 (ref 0.0–1.0)
pH: 5.5 (ref 5.0–8.0)

## 2020-12-25 LAB — LIPID PANEL
Cholesterol: 149 mg/dL (ref 0–200)
HDL: 46.4 mg/dL (ref >39.00)
LDL Cholesterol: 78 mg/dL (ref 0–99)
NonHDL: 102.74
Total CHOL/HDL Ratio: 3
Triglycerides: 125 mg/dL (ref 0.0–149.0)
VLDL: 25 mg/dL (ref 0.0–40.0)

## 2020-12-25 LAB — VITAMIN D 25 HYDROXY (VIT D DEFICIENCY, FRACTURES): VITD: 25.92 ng/mL — ABNORMAL LOW (ref 30.00–100.00)

## 2020-12-25 LAB — HEPATITIS C ANTIBODY
Hepatitis C Ab: NONREACTIVE
SIGNAL TO CUT-OFF: 0.03 (ref <1.00)

## 2020-12-25 LAB — RPR: RPR Ser Ql: NONREACTIVE

## 2020-12-25 LAB — HIV ANTIBODY (ROUTINE TESTING W REFLEX): HIV 1&2 Ab, 4th Generation: NONREACTIVE

## 2020-12-25 LAB — VITAMIN B12: Vitamin B-12: 1009 pg/mL — ABNORMAL HIGH (ref 211–911)

## 2020-12-26 ENCOUNTER — Encounter: Payer: Self-pay | Admitting: Physician Assistant

## 2021-01-16 ENCOUNTER — Ambulatory Visit: Payer: BC Managed Care – PPO | Admitting: Physician Assistant

## 2021-01-18 ENCOUNTER — Ambulatory Visit: Payer: BC Managed Care – PPO | Admitting: Physician Assistant

## 2021-01-18 ENCOUNTER — Inpatient Hospital Stay: Admission: RE | Admit: 2021-01-18 | Payer: BC Managed Care – PPO | Source: Ambulatory Visit

## 2021-01-28 ENCOUNTER — Encounter: Payer: Self-pay | Admitting: Podiatry

## 2021-01-28 ENCOUNTER — Other Ambulatory Visit: Payer: Self-pay

## 2021-01-28 ENCOUNTER — Ambulatory Visit (INDEPENDENT_AMBULATORY_CARE_PROVIDER_SITE_OTHER): Payer: BC Managed Care – PPO | Admitting: Podiatry

## 2021-01-28 DIAGNOSIS — M779 Enthesopathy, unspecified: Secondary | ICD-10-CM | POA: Diagnosis not present

## 2021-01-30 NOTE — Progress Notes (Signed)
Subjective:   Patient ID: Shannon Jarvis, female   DOB: 35 y.o.   MRN: [id number]   HPI Patient know she needs to get her bunions fixed and wants to do this but still needs to hold off and needs a new pair of orthotics stating she is doing very well but needs new pair   ROS      Objective:  Physical Exam  Neurovascular status intact significant flatfoot deformity with tendinitis-like symptomatology along with bunion deformity and inflammation of the joint surfaces     Assessment:  Inflammatory capsulitis along with moderate flatfoot deformity and structural bunion deformity left over right     Plan:  H&P reviewed condition recommended orthotics and patient is casted for functional orthotic devices will be seen back when ready

## 2021-02-04 ENCOUNTER — Ambulatory Visit: Payer: BC Managed Care – PPO | Admitting: Physician Assistant

## 2021-02-08 ENCOUNTER — Telehealth: Payer: Self-pay | Admitting: Podiatry

## 2021-02-08 NOTE — Telephone Encounter (Signed)
Orthotics in.. lvm for pt to call to schedule an appt to pick them up. °

## 2022-06-23 ENCOUNTER — Encounter: Payer: Self-pay | Admitting: *Deleted

## 2022-09-11 ENCOUNTER — Encounter: Payer: Self-pay | Admitting: *Deleted
